# Patient Record
Sex: Male | Born: 1965 | Race: Black or African American | Hispanic: No | Marital: Single | Smoking: Never smoker
Health system: Southern US, Community
[De-identification: ages and names within clinical notes are randomized; demographics above are authoritative.]

## PROBLEM LIST (undated history)

## (undated) DIAGNOSIS — N289 Disorder of kidney and ureter, unspecified: Secondary | ICD-10-CM

## (undated) HISTORY — PX: TUNNELED VENOUS PORT PLACEMENT: SHX819

---

## 2015-12-17 ENCOUNTER — Other Ambulatory Visit: Payer: Self-pay | Admitting: *Deleted

## 2015-12-17 DIAGNOSIS — Z01810 Encounter for preprocedural cardiovascular examination: Secondary | ICD-10-CM

## 2015-12-17 DIAGNOSIS — N186 End stage renal disease: Secondary | ICD-10-CM

## 2015-12-30 ENCOUNTER — Encounter: Payer: Self-pay | Admitting: Vascular Surgery

## 2016-01-05 ENCOUNTER — Ambulatory Visit: Payer: Medicare Other | Admitting: Vascular Surgery

## 2016-01-05 ENCOUNTER — Ambulatory Visit (HOSPITAL_COMMUNITY): Admission: RE | Admit: 2016-01-05 | Payer: Medicare Other | Source: Ambulatory Visit

## 2016-01-05 ENCOUNTER — Ambulatory Visit (HOSPITAL_COMMUNITY): Payer: Medicare Other | Attending: Vascular Surgery

## 2016-01-17 ENCOUNTER — Encounter: Payer: Self-pay | Admitting: Vascular Surgery

## 2016-01-28 ENCOUNTER — Other Ambulatory Visit (HOSPITAL_COMMUNITY): Payer: Medicare Other

## 2016-01-28 ENCOUNTER — Ambulatory Visit: Payer: Medicare Other | Admitting: Vascular Surgery

## 2016-01-28 ENCOUNTER — Encounter (HOSPITAL_COMMUNITY): Payer: Medicare Other

## 2016-05-26 ENCOUNTER — Other Ambulatory Visit (INDEPENDENT_AMBULATORY_CARE_PROVIDER_SITE_OTHER): Payer: Self-pay | Admitting: Vascular Surgery

## 2016-05-26 DIAGNOSIS — R066 Hiccough: Secondary | ICD-10-CM | POA: Diagnosis present

## 2016-05-26 DIAGNOSIS — R509 Fever, unspecified: Secondary | ICD-10-CM | POA: Diagnosis not present

## 2016-05-26 DIAGNOSIS — Z7982 Long term (current) use of aspirin: Secondary | ICD-10-CM

## 2016-05-26 DIAGNOSIS — I959 Hypotension, unspecified: Secondary | ICD-10-CM | POA: Diagnosis not present

## 2016-05-26 DIAGNOSIS — N186 End stage renal disease: Secondary | ICD-10-CM

## 2016-05-26 DIAGNOSIS — Z992 Dependence on renal dialysis: Secondary | ICD-10-CM

## 2016-05-26 DIAGNOSIS — E871 Hypo-osmolality and hyponatremia: Secondary | ICD-10-CM | POA: Diagnosis present

## 2016-05-26 DIAGNOSIS — T80219A Unspecified infection due to central venous catheter, initial encounter: Secondary | ICD-10-CM | POA: Diagnosis not present

## 2016-05-26 DIAGNOSIS — N2581 Secondary hyperparathyroidism of renal origin: Secondary | ICD-10-CM | POA: Diagnosis present

## 2016-05-26 DIAGNOSIS — Y848 Other medical procedures as the cause of abnormal reaction of the patient, or of later complication, without mention of misadventure at the time of the procedure: Secondary | ICD-10-CM | POA: Diagnosis present

## 2016-05-26 DIAGNOSIS — A4101 Sepsis due to Methicillin susceptible Staphylococcus aureus: Secondary | ICD-10-CM | POA: Diagnosis present

## 2016-05-26 DIAGNOSIS — I120 Hypertensive chronic kidney disease with stage 5 chronic kidney disease or end stage renal disease: Secondary | ICD-10-CM | POA: Diagnosis present

## 2016-05-26 DIAGNOSIS — D631 Anemia in chronic kidney disease: Secondary | ICD-10-CM | POA: Diagnosis present

## 2016-05-26 DIAGNOSIS — Z841 Family history of disorders of kidney and ureter: Secondary | ICD-10-CM

## 2016-05-26 DIAGNOSIS — Z79899 Other long term (current) drug therapy: Secondary | ICD-10-CM

## 2016-05-27 ENCOUNTER — Encounter: Payer: Self-pay | Admitting: Emergency Medicine

## 2016-05-27 ENCOUNTER — Inpatient Hospital Stay
Admission: EM | Admit: 2016-05-27 | Discharge: 2016-06-01 | DRG: 314 | Disposition: A | Discharge status: Home / Self Care | Payer: Medicare Other | Attending: Internal Medicine | Admitting: Internal Medicine

## 2016-05-27 ENCOUNTER — Emergency Department: Payer: Medicare Other

## 2016-05-27 DIAGNOSIS — Z841 Family history of disorders of kidney and ureter: Secondary | ICD-10-CM | POA: Diagnosis not present

## 2016-05-27 DIAGNOSIS — Z992 Dependence on renal dialysis: Secondary | ICD-10-CM | POA: Diagnosis not present

## 2016-05-27 DIAGNOSIS — N2581 Secondary hyperparathyroidism of renal origin: Secondary | ICD-10-CM | POA: Diagnosis present

## 2016-05-27 DIAGNOSIS — A4101 Sepsis due to Methicillin susceptible Staphylococcus aureus: Secondary | ICD-10-CM | POA: Diagnosis present

## 2016-05-27 DIAGNOSIS — Z79899 Other long term (current) drug therapy: Secondary | ICD-10-CM | POA: Diagnosis not present

## 2016-05-27 DIAGNOSIS — T80219A Unspecified infection due to central venous catheter, initial encounter: Secondary | ICD-10-CM | POA: Diagnosis present

## 2016-05-27 DIAGNOSIS — R7881 Bacteremia: Secondary | ICD-10-CM | POA: Diagnosis not present

## 2016-05-27 DIAGNOSIS — R509 Fever, unspecified: Secondary | ICD-10-CM | POA: Diagnosis present

## 2016-05-27 DIAGNOSIS — Y848 Other medical procedures as the cause of abnormal reaction of the patient, or of later complication, without mention of misadventure at the time of the procedure: Secondary | ICD-10-CM | POA: Diagnosis present

## 2016-05-27 DIAGNOSIS — T82838A Hemorrhage of vascular prosthetic devices, implants and grafts, initial encounter: Secondary | ICD-10-CM | POA: Diagnosis not present

## 2016-05-27 DIAGNOSIS — I120 Hypertensive chronic kidney disease with stage 5 chronic kidney disease or end stage renal disease: Secondary | ICD-10-CM | POA: Diagnosis present

## 2016-05-27 DIAGNOSIS — D631 Anemia in chronic kidney disease: Secondary | ICD-10-CM | POA: Diagnosis present

## 2016-05-27 DIAGNOSIS — Z7982 Long term (current) use of aspirin: Secondary | ICD-10-CM | POA: Diagnosis not present

## 2016-05-27 DIAGNOSIS — I959 Hypotension, unspecified: Secondary | ICD-10-CM | POA: Diagnosis not present

## 2016-05-27 DIAGNOSIS — E871 Hypo-osmolality and hyponatremia: Secondary | ICD-10-CM | POA: Diagnosis present

## 2016-05-27 DIAGNOSIS — N186 End stage renal disease: Secondary | ICD-10-CM | POA: Diagnosis present

## 2016-05-27 DIAGNOSIS — R066 Hiccough: Secondary | ICD-10-CM | POA: Diagnosis present

## 2016-05-27 DIAGNOSIS — A419 Sepsis, unspecified organism: Secondary | ICD-10-CM | POA: Diagnosis present

## 2016-05-27 HISTORY — DX: Disorder of kidney and ureter, unspecified: N28.9

## 2016-05-27 LAB — COMPREHENSIVE METABOLIC PANEL
ALBUMIN: 3.8 g/dL (ref 3.5–5.0)
ALK PHOS: 45 U/L (ref 38–126)
ALT: 44 U/L (ref 17–63)
ANION GAP: 19 — AB (ref 5–15)
AST: 38 U/L (ref 15–41)
BILIRUBIN TOTAL: 0.7 mg/dL (ref 0.3–1.2)
BUN: 66 mg/dL — ABNORMAL HIGH (ref 6–20)
CALCIUM: 9 mg/dL (ref 8.9–10.3)
CO2: 26 mmol/L (ref 22–32)
Chloride: 85 mmol/L — ABNORMAL LOW (ref 101–111)
Creatinine, Ser: 17.76 mg/dL — ABNORMAL HIGH (ref 0.61–1.24)
GFR, EST AFRICAN AMERICAN: 3 mL/min — AB (ref >60)
GFR, EST NON AFRICAN AMERICAN: 3 mL/min — AB (ref >60)
GLUCOSE: 121 mg/dL — AB (ref 65–99)
Potassium: 4.8 mmol/L (ref 3.5–5.1)
Sodium: 130 mmol/L — ABNORMAL LOW (ref 135–145)
TOTAL PROTEIN: 7.7 g/dL (ref 6.5–8.1)

## 2016-05-27 LAB — CBC WITH DIFFERENTIAL/PLATELET
BASOS PCT: 0 %
Basophils Absolute: 0 10*3/uL (ref 0–0.1)
EOS ABS: 0.1 10*3/uL (ref 0–0.7)
Eosinophils Relative: 1 %
HCT: 42.9 % (ref 40.0–52.0)
HEMOGLOBIN: 14.4 g/dL (ref 13.0–18.0)
Lymphocytes Relative: 4 %
Lymphs Abs: 0.5 10*3/uL — ABNORMAL LOW (ref 1.0–3.6)
MCH: 30.6 pg (ref 26.0–34.0)
MCHC: 33.6 g/dL (ref 32.0–36.0)
MCV: 91 fL (ref 80.0–100.0)
MONOS PCT: 9 %
Monocytes Absolute: 1.1 10*3/uL — ABNORMAL HIGH (ref 0.2–1.0)
NEUTROS PCT: 86 %
Neutro Abs: 9.9 10*3/uL — ABNORMAL HIGH (ref 1.4–6.5)
Platelets: 131 10*3/uL — ABNORMAL LOW (ref 150–440)
RBC: 4.72 MIL/uL (ref 4.40–5.90)
RDW: 15.2 % — AB (ref 11.5–14.5)
WBC: 11.6 10*3/uL — ABNORMAL HIGH (ref 3.8–10.6)

## 2016-05-27 LAB — MRSA PCR SCREENING

## 2016-05-27 LAB — LACTIC ACID, PLASMA: LACTIC ACID, VENOUS: 1.3 mmol/L (ref 0.5–1.9)

## 2016-05-27 LAB — TSH: TSH: 3.836 u[IU]/mL (ref 0.350–4.500)

## 2016-05-27 MED ORDER — BACLOFEN 10 MG PO TABS
10.0000 mg | ORAL_TABLET | Freq: Every day | ORAL | Status: DC | PRN
  Administered 2016-05-28: 10 mg via ORAL
  Filled 2016-05-27: qty 1

## 2016-05-27 MED ORDER — VANCOMYCIN HCL IN DEXTROSE 1-5 GM/200ML-% IV SOLN
1000.0000 mg | INTRAVENOUS | Status: DC
  Administered 2016-05-27: 1000 mg via INTRAVENOUS

## 2016-05-27 MED ORDER — DOCUSATE SODIUM 100 MG PO CAPS
100.0000 mg | ORAL_CAPSULE | Freq: Two times a day (BID) | ORAL | Status: DC
  Administered 2016-05-27 – 2016-05-31 (×7): 100 mg via ORAL
  Filled 2016-05-27 (×8): qty 1

## 2016-05-27 MED ORDER — SODIUM CHLORIDE 0.9 % IV BOLUS (SEPSIS): 500.0000 mL | INTRAVENOUS | Status: DC | PRN

## 2016-05-27 MED ORDER — SODIUM CHLORIDE 0.9 % IV BOLUS (SEPSIS)
500.0000 mL | Freq: Once | INTRAVENOUS | Status: AC
  Administered 2016-05-27: 500 mL via INTRAVENOUS

## 2016-05-27 MED ORDER — VANCOMYCIN HCL IN DEXTROSE 1-5 GM/200ML-% IV SOLN
1000.0000 mg | Freq: Once | INTRAVENOUS | Status: AC
  Administered 2016-05-27: 1000 mg via INTRAVENOUS
  Filled 2016-05-27: qty 200

## 2016-05-27 MED ORDER — SODIUM CHLORIDE 0.9% FLUSH
3.0000 mL | Freq: Two times a day (BID) | INTRAVENOUS | Status: DC
  Administered 2016-05-27 – 2016-05-31 (×10): 3 mL via INTRAVENOUS

## 2016-05-27 MED ORDER — PIPERACILLIN-TAZOBACTAM 3.375 G IVPB
3.3750 g | Freq: Two times a day (BID) | INTRAVENOUS | Status: DC
  Administered 2016-05-28: 3.375 g via INTRAVENOUS
  Filled 2016-05-27: qty 50

## 2016-05-27 MED ORDER — VANCOMYCIN HCL IN DEXTROSE 1-5 GM/200ML-% IV SOLN
1000.0000 mg | INTRAVENOUS | Status: DC | PRN
  Filled 2016-05-27 (×2): qty 200

## 2016-05-27 MED ORDER — SODIUM CHLORIDE 0.9 % IV SOLN
12.5000 mg | Freq: Three times a day (TID) | INTRAVENOUS | Status: DC | PRN
  Administered 2016-05-27 – 2016-05-29 (×4): 12.5 mg via INTRAVENOUS
  Filled 2016-05-27 (×9): qty 0.5

## 2016-05-27 MED ORDER — BACLOFEN 10 MG PO TABS: 10.0000 mg | ORAL_TABLET | Freq: Three times a day (TID) | ORAL | Status: DC

## 2016-05-27 MED ORDER — ONDANSETRON HCL 4 MG PO TABS: 4.0000 mg | ORAL_TABLET | Freq: Four times a day (QID) | ORAL | Status: DC | PRN

## 2016-05-27 MED ORDER — ACETAMINOPHEN 325 MG PO TABS
ORAL_TABLET | ORAL | Status: AC
  Administered 2016-05-27: 650 mg via ORAL
  Filled 2016-05-27: qty 2

## 2016-05-27 MED ORDER — CALCIUM ACETATE (PHOS BINDER) 667 MG/5ML PO SOLN
667.0000 mg | Freq: Three times a day (TID) | ORAL | Status: DC
  Administered 2016-05-27 – 2016-05-31 (×7): 667 mg via ORAL
  Filled 2016-05-27 (×17): qty 5

## 2016-05-27 MED ORDER — ASPIRIN EC 81 MG PO TBEC
81.0000 mg | DELAYED_RELEASE_TABLET | Freq: Every day | ORAL | Status: DC
  Administered 2016-05-27 – 2016-05-31 (×5): 81 mg via ORAL
  Filled 2016-05-27 (×5): qty 1

## 2016-05-27 MED ORDER — ACETAMINOPHEN 325 MG PO TABS
650.0000 mg | ORAL_TABLET | Freq: Four times a day (QID) | ORAL | Status: DC | PRN
  Administered 2016-05-27 – 2016-06-01 (×6): 650 mg via ORAL
  Filled 2016-05-27 (×5): qty 2

## 2016-05-27 MED ORDER — RENA-VITE PO TABS
1.0000 | ORAL_TABLET | Freq: Every day | ORAL | Status: DC
  Administered 2016-05-27 – 2016-05-31 (×5): 1 via ORAL
  Filled 2016-05-27 (×5): qty 1

## 2016-05-27 MED ORDER — ACETAMINOPHEN 650 MG RE SUPP: 650.0000 mg | Freq: Four times a day (QID) | RECTAL | Status: DC | PRN

## 2016-05-27 MED ORDER — ACETAMINOPHEN 325 MG PO TABS
650.0000 mg | ORAL_TABLET | Freq: Once | ORAL | Status: AC
  Administered 2016-05-27: 650 mg via ORAL

## 2016-05-27 MED ORDER — VANCOMYCIN HCL IN DEXTROSE 750-5 MG/150ML-% IV SOLN
750.0000 mg | Freq: Once | INTRAVENOUS | Status: AC
Start: 2016-05-27 — End: 2016-05-27
  Administered 2016-05-27: 750 mg via INTRAVENOUS
  Filled 2016-05-27: qty 150

## 2016-05-27 MED ORDER — PIPERACILLIN-TAZOBACTAM 3.375 G IVPB 30 MIN
3.3750 g | Freq: Once | INTRAVENOUS | Status: AC
  Administered 2016-05-27: 3.375 g via INTRAVENOUS
  Filled 2016-05-27: qty 50

## 2016-05-27 MED ORDER — ONDANSETRON HCL 4 MG/2ML IJ SOLN: 4.0000 mg | Freq: Four times a day (QID) | INTRAMUSCULAR | Status: DC | PRN

## 2016-05-27 MED ORDER — BACLOFEN 10 MG PO TABS
ORAL_TABLET | ORAL | Status: AC
  Filled 2016-05-27: qty 1

## 2016-05-27 MED ORDER — DILTIAZEM HCL ER COATED BEADS 120 MG PO CP24
120.0000 mg | ORAL_CAPSULE | Freq: Every day | ORAL | Status: DC
  Administered 2016-05-27 – 2016-05-31 (×5): 120 mg via ORAL
  Filled 2016-05-27 (×5): qty 1

## 2016-05-27 MED ORDER — HEPARIN SODIUM (PORCINE) 5000 UNIT/ML IJ SOLN
5000.0000 [IU] | Freq: Three times a day (TID) | INTRAMUSCULAR | Status: DC
  Administered 2016-05-27 – 2016-05-31 (×12): 5000 [IU] via SUBCUTANEOUS
  Filled 2016-05-27 (×13): qty 1

## 2016-05-27 MED ORDER — VANCOMYCIN HCL 500 MG IV SOLR
250.0000 mg | Freq: Once | INTRAVENOUS | Status: AC
  Administered 2016-05-27: 250 mg via INTRAVENOUS
  Filled 2016-05-27: qty 250

## 2016-05-27 MED ORDER — BACLOFEN 10 MG PO TABS
10.0000 mg | ORAL_TABLET | Freq: Once | ORAL | Status: AC
  Administered 2016-05-27: 10 mg via ORAL

## 2016-05-27 NOTE — Progress Notes (Signed)
Hd start 

## 2016-05-27 NOTE — Progress Notes (Signed)
Post hd vitals 

## 2016-05-27 NOTE — Progress Notes (Signed)
Pre hd info 

## 2016-05-27 NOTE — Progress Notes (Signed)
Post hd assessment 

## 2016-05-27 NOTE — ED Notes (Signed)
Pt transported to room 225. 

## 2016-05-27 NOTE — Progress Notes (Signed)
[facility]  ROUNDING NOTE   Subjective:   Admitted for sepsis.  Blood cultures from 11/2 showing staph species.   Seen and examined on hemodialysis. Tolerating treatment well.   Objective:  Vital signs in last 24 hours:  Temp:  [98.5 F (36.9 C)-100.9 F (38.3 C)] (P) 98.5 F (36.9 C) (11/04 1145) Pulse Rate:  [95-140] (P) 95 (11/04 1145) Resp:  [17-23] (P) 18 (11/04 1145) BP: (80-110)/(53-65) (P) 117/72 (11/04 1145) SpO2:  [92 %-100 %] (P) 98 % (11/04 1145) Weight:  [86.2 kg (190 lb)-87 kg (191 lb 12.8 oz)] 87 kg (191 lb 12.8 oz) (11/04 0341)  Weight change:  Filed Weights   05/27/16 0006 05/27/16 0341  Weight: 86.2 kg (190 lb) 87 kg (191 lb 12.8 oz)    Intake/Output: I/O last 3 completed shifts: In: 120 [P.O.:120] Out: 0    Intake/Output this shift:  No intake/output data recorded.  Physical Exam: General: NAD  Head: Normocephalic, atraumatic. Moist oral mucosal membranes  Eyes: Anicteric, PERRL  Neck: Supple, trachea midline  Lungs:  Clear to auscultation  Heart: Regular rate and rhythm  Abdomen:  Soft, nontender,   Extremities: no peripheral edema.  Neurologic: Nonfocal, moving all four extremities  Skin: No lesions  Access: Maturing left AVF, RIJ permcath    Basic Metabolic Panel:  Recent Labs Lab 05/27/16 0018  NA 130*  K 4.8  CL 85*  CO2 26  GLUCOSE 121*  BUN 66*  CREATININE 17.76*  CALCIUM 9.0    Liver Function Tests:  Recent Labs Lab 05/27/16 0018  AST 38  ALT 44  ALKPHOS 45  BILITOT 0.7  PROT 7.7  ALBUMIN 3.8   No results for input(s): LIPASE, AMYLASE in the last 168 hours. No results for input(s): AMMONIA in the last 168 hours.  CBC:  Recent Labs Lab 05/27/16 0018  WBC 11.6*  NEUTROABS 9.9*  HGB 14.4  HCT 42.9  MCV 91.0  PLT 131*    Cardiac Enzymes: No results for input(s): CKTOTAL, CKMB, CKMBINDEX, TROPONINI in the last 168 hours.  BNP: Invalid input(s): POCBNP  CBG: No results for input(s):  GLUCAP in the last 168 hours.  Microbiology: Results for orders placed or performed during the hospital encounter of 05/27/16  Culture, blood (Routine x 2)     Status: None (Preliminary result)   Collection Time: 05/27/16 12:18 AM  Result Value Ref Range Status   Specimen Description BLOOD BLOOD RIGHT FOREARM  Final   Special Requests BOTTLES DRAWN AEROBIC AND ANAEROBIC 7CCAERO,7CCANA  Final   Culture NO GROWTH < 12 HOURS  Final   Report Status PENDING  Incomplete  Culture, blood (Routine x 2)     Status: None (Preliminary result)   Collection Time: 05/27/16 12:18 AM  Result Value Ref Range Status   Specimen Description BLOOD RIGHT FOREARM LATERAL  Final   Special Requests   Final    BOTTLES DRAWN AEROBIC AND ANAEROBIC 12CCAERO,9CCANA   Culture NO GROWTH < 12 HOURS  Final   Report Status PENDING  Incomplete  MRSA PCR Screening     Status: Abnormal   Collection Time: 05/27/16  4:02 AM  Result Value Ref Range Status   MRSA by PCR (A) NEGATIVE Final    INVALID, UNABLE TO DETERMINE THE PRESENCE OF TARGET DNA DUE TO SPECIMEN INTEGRITY. RECOLLECTION REQUESTED.    Comment: CALLED DAWN SONGSCER ON 05/27/16 AT 0715 BY [facility]    Coagulation Studies: No results for input(s): LABPROT, INR in the last 72  hours.  Urinalysis: No results for input(s): COLORURINE, LABSPEC, PHURINE, GLUCOSEU, HGBUR, BILIRUBINUR, KETONESUR, PROTEINUR, UROBILINOGEN, NITRITE, LEUKOCYTESUR in the last 72 hours.  Invalid input(s): APPERANCEUR    Imaging: Dg Chest 2 View  Result Date: 05/27/2016 CLINICAL DATA:  Acute onset of nausea and vomiting. Fever. Burning sensation at the patient's tunneled catheter. Initial encounter. EXAM: CHEST  2 VIEW COMPARISON:  None. FINDINGS: The lungs are well-aerated. Vascular congestion is noted. A right-sided dual-lumen catheter is noted ending about the distal SVC. There is no evidence of focal opacification, pleural effusion or pneumothorax. The heart is normal in size; the mediastinal  contour is within normal limits. No acute osseous abnormalities are seen. IMPRESSION: Vascular congestion noted. Lungs remain grossly clear. Right-sided dual-lumen catheter noted ending about the distal SVC. Electronically Signed   By: Roanna RaiderJeffery  Chang M.D.   On: 05/27/2016 00:53     Medications:     . aspirin EC  81 mg Oral Daily  . calcium acetate (Phos Binder)  667 mg Oral TID WC  . diltiazem  120 mg Oral Daily  . docusate sodium  100 mg Oral BID  . heparin  5,000 Units Subcutaneous Q8H  . multivitamin  1 tablet Oral Daily  . piperacillin-tazobactam (ZOSYN)  IV  3.375 g Intravenous Q12H  . sodium chloride flush  3 mL Intravenous Q12H   acetaminophen **OR** acetaminophen, baclofen, chlorproMAZINE (THORAZINE) IV, ondansetron **OR** ondansetron (ZOFRAN) IV, sodium chloride, vancomycin  Assessment/ Plan:  Ronnie Cox is a 50 y.o. black male with hypertension, end stage renal disease on hemodialysis.   TTS [facility] [address]   1. End Stage Renal Disease: seen and examined on hemodialysis.  Positive blood cultures as outpatient. 11/2 growing staph species.  Vascular consulted.   2. Hypertension: hypotension due to fever/sepsis - hold home medications.   3. Secondary Hyperparathyroidism with hyperphosphatemia: outpatient PTH 560, phosphorus 6.4 - restart calcium acetate.   4. Anemia of chronic kidney disease: hemoglobin at goal. No indication for epo.    LOS: 0 Ronnie Cox 11/4/201712:03 PM

## 2016-05-27 NOTE — Progress Notes (Addendum)
[facility] [place] [facility]   PATIENT NAME: Ronnie HatchShawn Cox    MR#:  [id number]  DATE OF BIRTH:  1966-01-03  SUBJECTIVE:  CHIEF COMPLAINT:   Chief Complaint  Patient presents with  . Fever   No complaint except fever. On HD. REVIEW OF SYSTEMS:  Review of Systems  Constitutional: Positive for fever. Negative for chills and malaise/fatigue.  Eyes: Negative for blurred vision and double vision.  Respiratory: Negative for cough, shortness of breath, wheezing and stridor.   Cardiovascular: Negative for chest pain and leg swelling.  Gastrointestinal: Negative for abdominal pain, diarrhea, nausea and vomiting.  Genitourinary: Negative for dysuria.  Musculoskeletal: Negative for joint pain.  Skin: Negative for rash.  Neurological: Negative for dizziness, focal weakness, loss of consciousness, weakness and headaches.  Psychiatric/Behavioral: Negative for depression. The patient is not nervous/anxious.     DRUG ALLERGIES:   Allergies  Allergen Reactions  . Iodine Anaphylaxis   VITALS:  Blood pressure (!) 107/53, pulse (!) 113, temperature 98.5 F (36.9 C), temperature source Oral, resp. rate 18, height 6\' 3"  (1.905 m), weight 191 lb 2.2 oz (86.7 kg), SpO2 93 %. PHYSICAL EXAMINATION:  Physical Exam  Constitutional: He is oriented to person, place, and time and well-developed, well-nourished, and in no distress.  HENT:  Mouth/Throat: Oropharynx is clear and moist.  Eyes: Conjunctivae and EOM are normal.  Neck: Normal range of motion. Neck supple. No JVD present. No tracheal deviation present.  Cardiovascular: Normal rate, regular rhythm and normal heart sounds.  Exam reveals no gallop.   No murmur heard. Pulmonary/Chest: Effort normal and breath sounds normal. No respiratory distress. He has no wheezes. He has no rales.  Abdominal: Soft. Bowel sounds are normal. He exhibits no distension. There is no tenderness.  Musculoskeletal: Normal range of motion.  He exhibits no edema.  Neurological: He is alert and oriented to person, place, and time. No cranial nerve deficit.  Skin: No rash noted. No erythema.  Psychiatric: Affect and judgment normal.   LABORATORY PANEL:   CBC  Recent Labs Lab 05/27/16 0018  WBC 11.6*  HGB 14.4  HCT 42.9  PLT 131*   ------------------------------------------------------------------------------------------------------------------ Chemistries   Recent Labs Lab 05/27/16 0018  NA 130*  K 4.8  CL 85*  CO2 26  GLUCOSE 121*  BUN 66*  CREATININE 17.76*  CALCIUM 9.0  AST 38  ALT 44  ALKPHOS 45  BILITOT 0.7   RADIOLOGY:  Dg Chest 2 View  Result Date: 05/27/2016 CLINICAL DATA:  Acute onset of nausea and vomiting. Fever. Burning sensation at the patient's tunneled catheter. Initial encounter. EXAM: CHEST  2 VIEW COMPARISON:  None. FINDINGS: The lungs are well-aerated. Vascular congestion is noted. A right-sided dual-lumen catheter is noted ending about the distal SVC. There is no evidence of focal opacification, pleural effusion or pneumothorax. The heart is normal in size; the mediastinal contour is within normal limits. No acute osseous abnormalities are seen. IMPRESSION: Vascular congestion noted. Lungs remain grossly clear. Right-sided dual-lumen catheter noted ending about the distal SVC. Electronically Signed   By: Roanna RaiderJeffery  Chang M.D.   On: 05/27/2016 00:53   ASSESSMENT AND PLAN:   This is a 50 year old male admitted for sepsis. 1. Sepsis with staph aureus bacteremia. Likely source of infection is tunneled dialysis catheter. His outpatient blood cultures show staph aureus per Dr. Wynelle LinkKolluru.  Continue vancomycin and Zosyn for now. Follow up blood culture. Remove dialysis catheter after hemodialysis today per Dr. Wynelle LinkKolluru.  ID consult.  2. End-stage renal disease: Remove dialysis catheter after hemodialysis today per Dr. Wynelle LinkKolluru.  3. Hiccups: Started following vancomycin which the patient states is a  common side effect for him. He received baclofen in the emergency department without relief. On a small dose of Thorazine.  Hyponatremia. Adjust during hemodialysis.  per Dr. Wynelle LinkKolluru  All the records are reviewed and case discussed with Care Management/Social Worker. Management plans discussed with the patient, family and they are in agreement.  CODE STATUS: Full code  TOTAL TIME TAKING CARE OF THIS PATIENT: 36 minutes.   More than 50% of the time was spent in counseling/coordination of care: YES  POSSIBLE D/C IN 3 DAYS, DEPENDING ON CLINICAL CONDITION.   Shaune Pollackhen, Farhana Fellows M.D on 05/27/2016 at 4:02 PM  Between 7am to 6pm - Pager - [phone]  After 6pm go to [email or web address] - Social research officer, governmentpassword [email or web address]  [facility]  Office  [phone]  CC: Primary care physician; Pcp Not In System  Note: This dictation was prepared with Dragon dictation along with smaller phrase technology. Any transcriptional errors that result from this process are unintentional.

## 2016-05-27 NOTE — Progress Notes (Signed)
  End of hd 

## 2016-05-27 NOTE — Progress Notes (Signed)
Pharmacy Antibiotic Note  Ronnie Cox is a 50 y.o. male admitted on 05/27/2016 with sepsis.  Pharmacy has been consulted for vancomycin and Zosyn dosing.  Plan: ESRD on HD DW 86kg Vancomycin 2 grams total loading dose ordered. 1 gram with each HD Level will need to be ordered when HD schedule determined.  Zosyn 3.375 grams q 12 hours ordered.  Height: 6\' 3"  (190.5 cm) Weight: 190 lb (86.2 kg) IBW/kg (Calculated) : 84.5  Temp (24hrs), Avg:100.3 F (37.9 C), Min:99.4 F (37.4 C), Max:100.9 F (38.3 C)   Recent Labs Lab 05/27/16 0018  WBC 11.6*  CREATININE 17.76*  LATICACIDVEN 1.3    Estimated Creatinine Clearance: 6.1 mL/min (by C-G formula based on SCr of 17.76 mg/dL (H)).    Allergies  Allergen Reactions  . Iodine Anaphylaxis    Antimicrobials this admission: vancomycn 11/4 >>  Zosyn  11/4 >>   Dose adjustments this admission:   Microbiology results: 11/4  BCx: pending     Thank you for allowing pharmacy to be a part of this patient's care.  Lanijah Warzecha S 05/27/2016 2:38 AM

## 2016-05-27 NOTE — H&P (Signed)
Ronnie Cox is an 50 y.o. male.   Chief Complaint: Fever HPI: The patient with past medical history of end-stage renal disease on dialysis Monday Wednesday Friday presents to the emergency department after 2 days of fever. Yesterday MAXIMUM TEMPERATURE was 103; today temperature is 100.81F. The patient admits to nausea and some vomiting. He has had infections of his tunneled perm catheter on 2 previous occasions. In the emergency department vital signs indicated sepsis and he was started on broad-spectrum antibiotics as well as gentle fluid hydration. After initiation of septic protocol the emergency department staff called the hospitalist service for admission.  Past Medical History:  Diagnosis Date  . Renal disorder     Past Surgical History:  Procedure Laterality Date  . TUNNELED VENOUS PORT PLACEMENT      Family History  Problem Relation Age of Onset  . Renal Disease Mother    Social History:  reports that he has never smoked. He has never used smokeless tobacco. He reports that he does not drink alcohol or use drugs.  Allergies:  Allergies  Allergen Reactions  . Iodine Anaphylaxis    Medications Prior to Admission  Medication Sig Dispense Refill  . aspirin EC 81 MG tablet Take 81 mg by mouth daily.    . baclofen (LIORESAL) 10 MG tablet Take 10 mg by mouth daily as needed for muscle spasms.    . calcium acetate, Phos Binder, (PHOSLYRA) 667 MG/5ML SOLN Take 667 mg by mouth 3 (three) times daily with meals.    . DilTIAZem HCl ER Beads (DILTZAC PO) Take 1 tablet by mouth daily.    . multivitamin (RENA-VIT) TABS tablet Take 1 tablet by mouth daily.      Results for orders placed or performed during the hospital encounter of 05/27/16 (from the past 48 hour(s))  Comprehensive metabolic panel     Status: Abnormal   Collection Time: 05/27/16 12:18 AM  Result Value Ref Range   Sodium 130 (L) 135 - 145 mmol/L   Potassium 4.8 3.5 - 5.1 mmol/L   Chloride 85 (L) 101 - 111 mmol/L    CO2 26 22 - 32 mmol/L   Glucose, Bld 121 (H) 65 - 99 mg/dL   BUN 66 (H) 6 - 20 mg/dL   Creatinine, Ser 17.76 (H) 0.61 - 1.24 mg/dL   Calcium 9.0 8.9 - 10.3 mg/dL   Total Protein 7.7 6.5 - 8.1 g/dL   Albumin 3.8 3.5 - 5.0 g/dL   AST 38 15 - 41 U/L   ALT 44 17 - 63 U/L   Alkaline Phosphatase 45 38 - 126 U/L   Total Bilirubin 0.7 0.3 - 1.2 mg/dL   GFR calc non Af Amer 3 (L) >60 mL/min   GFR calc Af Amer 3 (L) >60 mL/min    Comment: (NOTE) The eGFR has been calculated using the CKD EPI equation. This calculation has not been validated in all clinical situations. eGFR's persistently <60 mL/min signify possible Chronic Kidney Disease.    Anion gap 19 (H) 5 - 15  Lactic acid, plasma     Status: None   Collection Time: 05/27/16 12:18 AM  Result Value Ref Range   Lactic Acid, Venous 1.3 0.5 - 1.9 mmol/L  CBC with Differential     Status: Abnormal   Collection Time: 05/27/16 12:18 AM  Result Value Ref Range   WBC 11.6 (H) 3.8 - 10.6 K/uL   RBC 4.72 4.40 - 5.90 MIL/uL   Hemoglobin 14.4 13.0 - 18.0 g/dL  HCT 42.9 40.0 - 52.0 %   MCV 91.0 80.0 - 100.0 fL   MCH 30.6 26.0 - 34.0 pg   MCHC 33.6 32.0 - 36.0 g/dL   RDW 15.2 (H) 11.5 - 14.5 %   Platelets 131 (L) 150 - 440 K/uL   Neutrophils Relative % 86 %   Neutro Abs 9.9 (H) 1.4 - 6.5 K/uL   Lymphocytes Relative 4 %   Lymphs Abs 0.5 (L) 1.0 - 3.6 K/uL   Monocytes Relative 9 %   Monocytes Absolute 1.1 (H) 0.2 - 1.0 K/uL   Eosinophils Relative 1 %   Eosinophils Absolute 0.1 0 - 0.7 K/uL   Basophils Relative 0 %   Basophils Absolute 0.0 0 - 0.1 K/uL  TSH     Status: None   Collection Time: 05/27/16 12:18 AM  Result Value Ref Range   TSH 3.836 0.350 - 4.500 uIU/mL    Comment: Performed by a 3rd Generation assay with a functional sensitivity of <=0.01 uIU/mL.   Dg Chest 2 View  Result Date: 05/27/2016 CLINICAL DATA:  Acute onset of nausea and vomiting. Fever. Burning sensation at the patient's tunneled catheter. Initial encounter.  EXAM: CHEST  2 VIEW COMPARISON:  None. FINDINGS: The lungs are well-aerated. Vascular congestion is noted. A right-sided dual-lumen catheter is noted ending about the distal SVC. There is no evidence of focal opacification, pleural effusion or pneumothorax. The heart is normal in size; the mediastinal contour is within normal limits. No acute osseous abnormalities are seen. IMPRESSION: Vascular congestion noted. Lungs remain grossly clear. Right-sided dual-lumen catheter noted ending about the distal SVC. Electronically Signed   By: Garald Balding M.D.   On: 05/27/2016 00:53    Review of Systems  Constitutional: Positive for fever. Negative for chills.  HENT: Negative for sore throat and tinnitus.   Eyes: Negative for blurred vision and redness.  Respiratory: Negative for cough and shortness of breath.   Cardiovascular: Negative for chest pain, palpitations, orthopnea and PND.  Gastrointestinal: Positive for nausea and vomiting. Negative for abdominal pain and diarrhea.  Genitourinary: Negative for dysuria, frequency and urgency.  Musculoskeletal: Negative for joint pain and myalgias.  Skin: Negative for rash.       No lesions  Neurological: Negative for speech change, focal weakness and weakness.  Endo/Heme/Allergies: Does not bruise/bleed easily.       No temperature intolerance  Psychiatric/Behavioral: Negative for depression and suicidal ideas.    Blood pressure (!) 92/53, pulse (!) 108, temperature 99.3 F (37.4 C), temperature source Oral, resp. rate 18, height 6' 3"  (1.905 m), weight 87 kg (191 lb 12.8 oz), SpO2 96 %. Physical Exam  Nursing note and vitals reviewed. Constitutional: He is oriented to person, place, and time. He appears well-developed and well-nourished. No distress.  HENT:  Head: Normocephalic and atraumatic.  Mouth/Throat: Oropharynx is clear and moist.  Eyes: Conjunctivae and EOM are normal. Pupils are equal, round, and reactive to light. No scleral icterus.   Neck: Normal range of motion. Neck supple. No JVD present. No tracheal deviation present. No thyromegaly present.  Cardiovascular: Normal rate, regular rhythm and normal heart sounds.  Exam reveals no gallop and no friction rub.   No murmur heard. Respiratory: Effort normal and breath sounds normal. No respiratory distress.  GI: Soft. Bowel sounds are normal. He exhibits no distension. There is no tenderness.  Genitourinary:  Genitourinary Comments: Deferred  Musculoskeletal: Normal range of motion. He exhibits no edema.  Lymphadenopathy:    He has  no cervical adenopathy.  Neurological: He is alert and oriented to person, place, and time. No cranial nerve deficit.  Skin: Skin is warm and dry. No rash noted. No erythema.  Psychiatric: He has a normal mood and affect. His behavior is normal. Judgment and thought content normal.     Assessment/Plan This is a 50 year old male admitted for sepsis. 1. Sepsis: The patient meets criteria via fever, leukocytosis and tachycardia. He is hemodynamically stable although he admits that his blood pressure is usually in the low 100s. He is fluid responsive. We will continue gentle hydration so as not to fluid overload the patient. Likely source of infection is tunneled dialysis catheter. 2. End-stage renal disease: On dialysis. Nephrology has been consulted. 3. Hiccups: Started following vancomycin which the patient states is a common side effect for him. He received baclofen in the emergency department without relief. We will get a small dose of Thorazine. 4. DVT prophylaxis: Heparin 5. GI prophylaxis: None The patient is a full code. Time spent on admission was inpatient care approximately 45 minutes  Harrie Foreman, MD 05/27/2016, 6:11 AM

## 2016-05-27 NOTE — Progress Notes (Signed)
PHARMACY - PHYSICIAN COMMUNICATION CRITICAL VALUE ALERT - BLOOD CULTURE IDENTIFICATION (BCID)  Results for orders placed or performed during the hospital encounter of 05/27/16  Blood Culture ID Panel (Reflexed) (Collected: 05/27/2016 12:18 AM)  Result Value Ref Range   Enterococcus species NOT DETECTED NOT DETECTED   Listeria monocytogenes NOT DETECTED NOT DETECTED   Staphylococcus species DETECTED (A) NOT DETECTED   Staphylococcus aureus DETECTED (A) NOT DETECTED   Methicillin resistance NOT DETECTED NOT DETECTED   Streptococcus species NOT DETECTED NOT DETECTED   Streptococcus agalactiae NOT DETECTED NOT DETECTED   Streptococcus pneumoniae NOT DETECTED NOT DETECTED   Streptococcus pyogenes NOT DETECTED NOT DETECTED   Acinetobacter baumannii NOT DETECTED NOT DETECTED   Enterobacteriaceae species NOT DETECTED NOT DETECTED   Enterobacter cloacae complex NOT DETECTED NOT DETECTED   Escherichia coli NOT DETECTED NOT DETECTED   Klebsiella oxytoca NOT DETECTED NOT DETECTED   Klebsiella pneumoniae NOT DETECTED NOT DETECTED   Proteus species NOT DETECTED NOT DETECTED   Serratia marcescens NOT DETECTED NOT DETECTED   Haemophilus influenzae NOT DETECTED NOT DETECTED   Neisseria meningitidis NOT DETECTED NOT DETECTED   Pseudomonas aeruginosa NOT DETECTED NOT DETECTED   Candida albicans NOT DETECTED NOT DETECTED   Candida glabrata NOT DETECTED NOT DETECTED   Candida krusei NOT DETECTED NOT DETECTED   Candida parapsilosis NOT DETECTED NOT DETECTED   Candida tropicalis NOT DETECTED NOT DETECTED    Name of physician (or Provider) Contacted: Dr. Auburn BilberryShreyang Patel  Recommendation: To narrow therapy based on BCID results showing staph aureus (MecA negative). Recommendation to switch to cefazolin or D/C of vancomycin    Changes to prescribed antibiotics required: MD does not want to make Abx changes at this time. Will f/U with rounding MD in AM.   Lucius ConnSheema M Melba Araki 05/27/2016  7:45 PM

## 2016-05-27 NOTE — ED Triage Notes (Signed)
Pt states this past Thursday night that he experienced nausea and vomiting after eating. Pt states that he has been febrile x2 days and that his tunnel catheter site is burning and itching. Pt is a dialysis pt and receives dialysis Tuesday, Thursday, and Saturday. Pt is experiencing nausea at this time.

## 2016-05-27 NOTE — ED Notes (Signed)
Pt states he rarely makes urine

## 2016-05-27 NOTE — ED Provider Notes (Signed)
[facility] Emergency Department Provider Note    First MD Initiated Contact with Patient 05/27/16 [phone]     (approximate)  I have reviewed the triage vital signs and the nursing notes.   HISTORY  Chief Complaint Fever    HPI Ronnie Cox is a 50 y.o. male history of hypertension and end-stage renal disease receives dialysis Tuesday Thursday Saturday. Patient presents to the emergency department with fever 2 days. Patient denies any other symptoms no nausea vomiting or diarrhea. Patient denies any coughing. Of note patient has an indwelling catheter in the right subclavian area of his chest. Patient states that he has been "septic 3 times from indwelling catheter for dialysis". Patient presents emergency Department febrile with a temperature 100.9 tachycardic heart rate 140 hypotensive blood pressure 80/56   Past Medical History:  Diagnosis Date  . Renal disorder     There are no active problems to display for this patient.   Past Surgical History:  Procedure Laterality Date  . TUNNELED VENOUS PORT PLACEMENT      Prior to Admission medications   Medication Sig Start Date End Date Taking? Authorizing Provider  aspirin EC 81 MG tablet Take 81 mg by mouth daily.   Yes Historical Provider, MD  baclofen (LIORESAL) 10 MG tablet Take 10 mg by mouth daily as needed for muscle spasms.   Yes Historical Provider, MD  calcium acetate, Phos Binder, (PHOSLYRA) 667 MG/5ML SOLN Take 667 mg by mouth 3 (three) times daily with meals.   Yes Historical Provider, MD  DilTIAZem HCl ER Beads (DILTZAC PO) Take 1 tablet by mouth daily.   Yes Historical Provider, MD  multivitamin (RENA-VIT) TABS tablet Take 1 tablet by mouth daily.   Yes Historical Provider, MD    Allergies Iodine  No family history on file.  Social History Social History  Substance Use Topics  . Smoking status: Never Smoker  . Smokeless tobacco: Never Used  . Alcohol use No    Review of  Systems Constitutional: Positive for fever/chills Eyes: No visual changes. ENT: No sore throat. Cardiovascular: Denies chest pain. Respiratory: Denies shortness of breath. Gastrointestinal: No abdominal pain.  No nausea, no vomiting.  No diarrhea.  No constipation. Genitourinary: Negative for dysuria. Musculoskeletal: Negative for back pain. Skin: Negative for rash. Neurological: Negative for headaches, focal weakness or numbness.  10-point ROS otherwise negative.  ____________________________________________   PHYSICAL EXAM:  VITAL SIGNS: ED Triage Vitals  Enc Vitals Group     BP 05/27/16 0005 (!) 80/56     Pulse Rate 05/27/16 0005 (!) 140     Resp 05/27/16 0005 20     Temp 05/27/16 0005 (!) 100.9 F (38.3 C)     Temp Source 05/27/16 0005 Oral     SpO2 05/27/16 0005 97 %     Weight 05/27/16 0006 190 lb (86.2 kg)     Height 05/27/16 0006 6\' 3"  (1.905 m)     Head Circumference --      Peak Flow --      Pain Score 05/27/16 0006 0     Pain Loc --      Pain Edu? --      Excl. in GC? --     Constitutional: Alert and oriented. Well appearing and in no acute distress. Eyes: Conjunctivae are normal. PERRL. EOMI. Head: Atraumatic. Nose: No congestion/rhinnorhea. Mouth/Throat: Mucous membranes are moist.  Oropharynx non-erythematous. Neck: No stridor.  No meningeal signs.  No cervical spine tenderness to palpation.  Cardiovascular: Normal rate, regular rhythm. Good peripheral circulation. Grossly normal heart sounds. Respiratory: Normal respiratory effort.  No retractions. Lungs CTAB. Gastrointestinal: Soft and nontender. No distention.  Musculoskeletal: No lower extremity tenderness nor edema. No gross deformities of extremities. Neurologic:  Normal speech and language. No gross focal neurologic deficits are appreciated.  Skin:  Skin is warm, dry and intact. No rash noted. Psychiatric: Mood and affect are normal. Speech and behavior are  normal.  ____________________________________________   LABS (all labs ordered are listed, but only abnormal results are displayed)  Labs Reviewed  COMPREHENSIVE METABOLIC PANEL - Abnormal; Notable for the following:       Result Value   Sodium 130 (*)    Chloride 85 (*)    Glucose, Bld 121 (*)    BUN 66 (*)    Creatinine, Ser 17.76 (*)    GFR calc non Af Amer 3 (*)    GFR calc Af Amer 3 (*)    Anion gap 19 (*)    All other components within normal limits  CBC WITH DIFFERENTIAL/PLATELET - Abnormal; Notable for the following:    WBC 11.6 (*)    RDW 15.2 (*)    Platelets 131 (*)    Neutro Abs 9.9 (*)    Lymphs Abs 0.5 (*)    Monocytes Absolute 1.1 (*)    All other components within normal limits  CULTURE, BLOOD (ROUTINE X 2)  CULTURE, BLOOD (ROUTINE X 2)  LACTIC ACID, PLASMA  LACTIC ACID, PLASMA   ____________________________________________  EKG  ED ECG REPORT I, Leonard N Illene Sweeting, the attending physician, personally viewed and interpreted this ECG.   Date: 05/27/2016  EKG Time: 12:44 AM  Rate: 127  Rhythm: Sinus tachycardia  Axis: Normal  Intervals: Normal  ST&T Change: None  ____________________________________________  RADIOLOGY I, [place] N Hamdi Kley, personally viewed and evaluated these images (plain radiographs) as part of my medical decision making, as well as reviewing the written report by the radiologist.  Dg Chest 2 View  Result Date: 05/27/2016 CLINICAL DATA:  Acute onset of nausea and vomiting. Fever. Burning sensation at the patient's tunneled catheter. Initial encounter. EXAM: CHEST  2 VIEW COMPARISON:  None. FINDINGS: The lungs are well-aerated. Vascular congestion is noted. A right-sided dual-lumen catheter is noted ending about the distal SVC. There is no evidence of focal opacification, pleural effusion or pneumothorax. The heart is normal in size; the mediastinal contour is within normal limits. No acute osseous abnormalities are seen. IMPRESSION:  Vascular congestion noted. Lungs remain grossly clear. Right-sided dual-lumen catheter noted ending about the distal SVC. Electronically Signed   By: Roanna Raider M.D.   On: 05/27/2016 00:53     Procedures   Critical Care performed: CRITICAL CARE Performed by: Darci Current   Total critical care time: 40 minutes  Critical care time was exclusive of separately billable procedures and treating other patients.  Critical care was necessary to treat or prevent imminent or life-threatening deterioration.  Critical care was time spent personally by me on the following activities: development of treatment plan with patient and/or surrogate as well as nursing, discussions with consultants, evaluation of patient's response to treatment, examination of patient, obtaining history from patient or surrogate, ordering and performing treatments and interventions, ordering and review of laboratory studies, ordering and review of radiographic studies, pulse oximetry and re-evaluation of patient's condition. ____________________________________________   INITIAL IMPRESSION / ASSESSMENT AND PLAN / ED COURSE  Pertinent labs & imaging results that were available during my  care of the patient were reviewed by me and considered in my medical decision making (see chart for details).  History of physical exam consistent with sepsis most likely secondary to the patient's indwelling dialysis catheter. Patient given IV vancomycin and Zosyn the emergency department as well as IV normal saline. Patient discussed with Dr. Sheryle Hailiamond for Hospital admission for further evaluation and management.   Clinical Course    ____________________________________________  FINAL CLINICAL IMPRESSION(S) / ED DIAGNOSES  Final diagnoses:  Sepsis, due to unspecified organism [facility](HCC)     MEDICATIONS GIVEN DURING THIS VISIT:  Medications  piperacillin-tazobactam (ZOSYN) IVPB 3.375 g (3.375 g Intravenous New Bag/Given 05/27/16  0055)  vancomycin (VANCOCIN) IVPB 1000 mg/200 mL premix (1,000 mg Intravenous New Bag/Given 05/27/16 0055)  sodium chloride 0.9 % bolus 500 mL (500 mLs Intravenous New Bag/Given 05/27/16 0055)  baclofen (LIORESAL) tablet 10 mg (10 mg Oral Given 05/27/16 0050)  acetaminophen (TYLENOL) tablet 650 mg (650 mg Oral Given 05/27/16 0100)     NEW OUTPATIENT MEDICATIONS STARTED DURING THIS VISIT:  New Prescriptions   No medications on file    Modified Medications   No medications on file    Discontinued Medications   No medications on file     Note:  This document was prepared using Dragon voice recognition software and may include unintentional dictation errors.    Darci Currentandolph N Luster Hechler, MD 05/27/16 [phone]

## 2016-05-27 NOTE — Progress Notes (Signed)
Pre hd assessment  

## 2016-05-28 DIAGNOSIS — N186 End stage renal disease: Secondary | ICD-10-CM

## 2016-05-28 DIAGNOSIS — R7881 Bacteremia: Secondary | ICD-10-CM

## 2016-05-28 LAB — BLOOD CULTURE ID PANEL (REFLEXED)
ACINETOBACTER BAUMANNII: NOT DETECTED
CANDIDA ALBICANS: NOT DETECTED
CANDIDA GLABRATA: NOT DETECTED
CANDIDA KRUSEI: NOT DETECTED
Candida parapsilosis: NOT DETECTED
Candida tropicalis: NOT DETECTED
ENTEROBACTER CLOACAE COMPLEX: NOT DETECTED
ENTEROCOCCUS SPECIES: NOT DETECTED
ESCHERICHIA COLI: NOT DETECTED
Enterobacteriaceae species: NOT DETECTED
Haemophilus influenzae: NOT DETECTED
Klebsiella oxytoca: NOT DETECTED
Klebsiella pneumoniae: NOT DETECTED
LISTERIA MONOCYTOGENES: NOT DETECTED
Neisseria meningitidis: NOT DETECTED
PSEUDOMONAS AERUGINOSA: NOT DETECTED
Proteus species: NOT DETECTED
STREPTOCOCCUS AGALACTIAE: NOT DETECTED
STREPTOCOCCUS PNEUMONIAE: NOT DETECTED
Serratia marcescens: NOT DETECTED
Staphylococcus aureus (BCID): DETECTED — AB
Staphylococcus species: DETECTED — AB
Streptococcus pyogenes: NOT DETECTED
Streptococcus species: NOT DETECTED

## 2016-05-28 MED ORDER — LIDOCAINE HCL (PF) 1 % IJ SOLN
30.0000 mL | Freq: Once | INTRAMUSCULAR | Status: AC
  Administered 2016-05-28: 30 mL via INTRADERMAL
  Filled 2016-05-28: qty 30

## 2016-05-28 MED ORDER — OXYCODONE-ACETAMINOPHEN 5-325 MG PO TABS
1.0000 | ORAL_TABLET | Freq: Four times a day (QID) | ORAL | Status: DC | PRN
  Administered 2016-05-28 – 2016-05-29 (×3): 1 via ORAL
  Filled 2016-05-28 (×3): qty 1

## 2016-05-28 MED ORDER — CEFAZOLIN IN D5W 1 GM/50ML IV SOLN
1.0000 g | INTRAVENOUS | Status: DC
  Administered 2016-05-28 – 2016-05-31 (×4): 1 g via INTRAVENOUS
  Filled 2016-05-28 (×8): qty 50

## 2016-05-28 MED ORDER — LIDOCAINE HCL (PF) 1 % IJ SOLN
30.0000 mL | Freq: Once | INTRAMUSCULAR | Status: DC
  Filled 2016-05-28: qty 30

## 2016-05-28 NOTE — Consult Note (Signed)
[facility] Vascular Consult Note  MRN : [id number]  Ronnie Cox is a 50 y.o. (1965-10-28) male who presents with chief complaint of  Chief Complaint  Patient presents with  . Fever  .  History of Present Illness: I am asked to see the patient by Dr. Wynelle LinkKolluru from nephrology for bacteremia and an existing PermCath. He was not toxic appearing but did have fever on admission. He has a fistula which needs to be transposed for use and is not ready to be used immediately. He has been on dialysis for 9 years. He has the hiccups but otherwise no complaints today. His blood cultures grew back staph RES and his PermCath will need to be removed.   Current Facility-Administered Medications  Medication Dose Route Frequency Provider Last Rate Last Dose  . acetaminophen (TYLENOL) tablet 650 mg  650 mg Oral Q6H PRN Arnaldo NatalMichael S Diamond, MD   650 mg at 05/27/16 2142   Or  . acetaminophen (TYLENOL) suppository 650 mg  650 mg Rectal Q6H PRN Arnaldo NatalMichael S Diamond, MD      . aspirin EC tablet 81 mg  81 mg Oral Daily Arnaldo NatalMichael S Diamond, MD   81 mg at 05/27/16 1525  . baclofen (LIORESAL) tablet 10 mg  10 mg Oral Daily PRN Arnaldo NatalMichael S Diamond, MD      . calcium acetate (Phos Binder) [facility](PHOSLYRA) 667 MG/5ML oral solution 667 mg  667 mg Oral TID WC Arnaldo NatalMichael S Diamond, MD   667 mg at 05/27/16 1716  . chlorproMAZINE (THORAZINE) 12.5 mg in sodium chloride 0.9 % 25 mL IVPB  12.5 mg Intravenous Q8H PRN Arnaldo NatalMichael S Diamond, MD   12.5 mg at 05/27/16 2307  . diltiazem (CARDIZEM CD) 24 hr capsule 120 mg  120 mg Oral Daily Arnaldo NatalMichael S Diamond, MD   120 mg at 05/27/16 1525  . docusate sodium (COLACE) capsule 100 mg  100 mg Oral BID Arnaldo NatalMichael S Diamond, MD   100 mg at 05/27/16 2134  . heparin injection 5,000 Units  5,000 Units Subcutaneous Q8H Arnaldo NatalMichael S Diamond, MD   5,000 Units at 05/28/16 0518  . lidocaine (PF) (XYLOCAINE) 1 % injection 30 mL  30 mL Intradermal Once Annice NeedyJason S Berman Grainger, MD      . lidocaine (PF) (XYLOCAINE)  1 % injection 30 mL  30 mL Intradermal Once Annice NeedyJason S Brinden Kincheloe, MD      . multivitamin (RENA-VIT) tablet 1 tablet  1 tablet Oral Daily Arnaldo NatalMichael S Diamond, MD   1 tablet at 05/27/16 1525  . ondansetron (ZOFRAN) tablet 4 mg  4 mg Oral Q6H PRN Arnaldo NatalMichael S Diamond, MD       Or  . ondansetron [facility](ZOFRAN) injection 4 mg  4 mg Intravenous Q6H PRN Arnaldo NatalMichael S Diamond, MD      . piperacillin-tazobactam (ZOSYN) IVPB 3.375 g  3.375 g Intravenous Q12H Darci Currentandolph N Brown, MD   3.375 g at 05/28/16 0005  . sodium chloride 0.9 % bolus 500 mL  500 mL Intravenous PRN Arnaldo NatalMichael S Diamond, MD      . sodium chloride flush (NS) 0.9 % injection 3 mL  3 mL Intravenous Q12H Arnaldo NatalMichael S Diamond, MD   3 mL at 05/27/16 2307  . vancomycin (VANCOCIN) IVPB 1000 mg/200 mL premix  1,000 mg Intravenous Q T,Th,Sa-HD Darci Currentandolph N Brown, MD   1,000 mg at 05/27/16 1405    Past Medical History:  Diagnosis Date  . Renal disorder     Past Surgical History:  Procedure  Laterality Date  . TUNNELED VENOUS PORT PLACEMENT    Multiple previous dialysis access placements   Social History Social History  Substance Use Topics  . Smoking status: Never Smoker  . Smokeless tobacco: Never Used  . Alcohol use No    Family History Family History  Problem Relation Age of Onset  . Renal Disease Mother   No bleeding disorders, clotting disorders, or autoimmune diseases  Allergies  Allergen Reactions  . Iodine Anaphylaxis     REVIEW OF SYSTEMS (Negative unless checked)  Constitutional: [] Weight loss  [x] Fever  [x] Chills Cardiac: [] Chest pain   [] Chest pressure   [] Palpitations   [] Shortness of breath when laying flat   [] Shortness of breath at rest   [] Shortness of breath with exertion. Vascular:  [] Pain in legs with walking   [] Pain in legs at rest   [] Pain in legs when laying flat   [] Claudication   [] Pain in feet when walking  [] Pain in feet at rest  [] Pain in feet when laying flat   [] History of DVT   [] Phlebitis   [] Swelling in legs   [] Varicose  veins   [] Non-healing ulcers Pulmonary:   [] Uses home oxygen   [] Productive cough   [] Hemoptysis   [] Wheeze  [] COPD   [] Asthma Neurologic:  [] Dizziness  [] Blackouts   [] Seizures   [] History of stroke   [] History of TIA  [] Aphasia   [] Temporary blindness   [] Dysphagia   [] Weakness or numbness in arms   [] Weakness or numbness in legs Musculoskeletal:  [] Arthritis   [] Joint swelling   [] Joint pain   [] Low back pain Hematologic:  [] Easy bruising  [] Easy bleeding   [] Hypercoagulable state   [] Anemic  [] Hepatitis Gastrointestinal:  [] Blood in stool   [] Vomiting blood  [] Gastroesophageal reflux/heartburn   [] Difficulty swallowing. Genitourinary:  [x] Chronic kidney disease   [] Difficult urination  [] Frequent urination  [] Burning with urination   [] Blood in urine Skin:  [] Rashes   [] Ulcers   [] Wounds Psychological:  [] History of anxiety   []  History of major depression.  Physical Examination  Vitals:   05/27/16 1531 05/27/16 2049 05/28/16 0500 05/28/16 0502  BP: (!) 107/53 (!) 109/50  (!) 113/52  Pulse: (!) 113 (!) 101  99  Resp: 18 17  17   Temp: 98.5 F (36.9 C) 98.6 F (37 C)  98.2 F (36.8 C)  TempSrc: Oral Oral  Oral  SpO2: 93% 99%  92%  Weight:   86.5 kg (190 lb 12.8 oz)   Height:       Body mass index is 23.85 kg/m. Gen:  WD/WN, NAD Head: [place]/AT, No temporalis wasting. Prominent temp pulse not noted. Ear/Nose/Throat: Hearing grossly intact, nares w/o erythema or drainage, oropharynx w/o Erythema/Exudate Eyes: Sclera non-icteric, conjunctiva clear Neck: Trachea midline.  No JVD.  Pulmonary:  Good air movement, respirations not labored, equal bilaterally.  Cardiac: RRR, normal S1, S2. Vascular: PermCath exiting the right chest without erythema or drainage. Good thrill and bruit in his left brachiobasilic AV fistula Vessel Right Left  Radial Palpable Palpable                                   Gastrointestinal: soft, non-tender/non-distended. No guarding/reflex.   Musculoskeletal:Extremities without ischemic changes.  No deformity or atrophy. No edema. Neurologic: Sensation grossly intact in extremities.  Symmetrical.  Speech is fluent.  Psychiatric: Judgment intact, Mood & affect appropriate for pt's clinical situation. Dermatologic: No rashes or  ulcers noted.  No cellulitis or open wounds. Lymph : No Cervical, Axillary, or Inguinal lymphadenopathy.      CBC Lab Results  Component Value Date   WBC 11.6 (H) 05/27/2016   HGB 14.4 05/27/2016   HCT 42.9 05/27/2016   MCV 91.0 05/27/2016   PLT 131 (L) 05/27/2016    BMET    Component Value Date/Time   NA 130 (L) 05/27/2016 0018   K 4.8 05/27/2016 0018   CL 85 (L) 05/27/2016 0018   CO2 26 05/27/2016 0018   GLUCOSE 121 (H) 05/27/2016 0018   BUN 66 (H) 05/27/2016 0018   CREATININE 17.76 (H) 05/27/2016 0018   CALCIUM 9.0 05/27/2016 0018   GFRNONAA 3 (L) 05/27/2016 0018   GFRAA 3 (L) 05/27/2016 0018   Estimated Creatinine Clearance: 6.1 mL/min (by C-G formula based on SCr of 17.76 mg/dL (H)).  COAG No results found for: INR, PROTIME  Radiology Dg Chest 2 View  Result Date: 05/27/2016 CLINICAL DATA:  Acute onset of nausea and vomiting. Fever. Burning sensation at the patient's tunneled catheter. Initial encounter. EXAM: CHEST  2 VIEW COMPARISON:  None. FINDINGS: The lungs are well-aerated. Vascular congestion is noted. A right-sided dual-lumen catheter is noted ending about the distal SVC. There is no evidence of focal opacification, pleural effusion or pneumothorax. The heart is normal in size; the mediastinal contour is within normal limits. No acute osseous abnormalities are seen. IMPRESSION: Vascular congestion noted. Lungs remain grossly clear. Right-sided dual-lumen catheter noted ending about the distal SVC. Electronically Signed   By: Roanna Raider M.D.   On: 05/27/2016 00:53      Assessment/Plan 1. Bacteremia with PermCath in place which is the likely source. PermCath will need  to be removed. Risks and benefits discussed. 2.  End-stage renal disease. Will need to have a PermCath replaced once his blood cultures have cleared. He reports that his left brachiobasilic AV fistula will need to be transposed first, which is typical before it can be used. This fistula is now about 2 months old and I think that can be done at any time.    Festus Barren, MD  05/28/2016 9:56 AM    This note was created with Dragon medical transcription system.  Any error is purely unintentional

## 2016-05-28 NOTE — Progress Notes (Signed)
PHARMACY - PHYSICIAN COMMUNICATION CRITICAL VALUE ALERT - BLOOD CULTURE IDENTIFICATION (BCID)  Results for orders placed or performed during the hospital encounter of 05/27/16  Blood Culture ID Panel (Reflexed) (Collected: 05/27/2016 12:18 AM)  Result Value Ref Range   Enterococcus species NOT DETECTED NOT DETECTED   Listeria monocytogenes NOT DETECTED NOT DETECTED   Staphylococcus species DETECTED (A) NOT DETECTED   Staphylococcus aureus DETECTED (A) NOT DETECTED   Streptococcus species NOT DETECTED NOT DETECTED   Streptococcus agalactiae NOT DETECTED NOT DETECTED   Streptococcus pneumoniae NOT DETECTED NOT DETECTED   Streptococcus pyogenes NOT DETECTED NOT DETECTED   Acinetobacter baumannii NOT DETECTED NOT DETECTED   Enterobacteriaceae species NOT DETECTED NOT DETECTED   Enterobacter cloacae complex NOT DETECTED NOT DETECTED   Escherichia coli NOT DETECTED NOT DETECTED   Klebsiella oxytoca NOT DETECTED NOT DETECTED   Klebsiella pneumoniae NOT DETECTED NOT DETECTED   Proteus species NOT DETECTED NOT DETECTED   Serratia marcescens NOT DETECTED NOT DETECTED   Haemophilus influenzae NOT DETECTED NOT DETECTED   Neisseria meningitidis NOT DETECTED NOT DETECTED   Pseudomonas aeruginosa NOT DETECTED NOT DETECTED   Candida albicans NOT DETECTED NOT DETECTED   Candida glabrata NOT DETECTED NOT DETECTED   Candida krusei NOT DETECTED NOT DETECTED   Candida parapsilosis NOT DETECTED NOT DETECTED   Candida tropicalis NOT DETECTED NOT DETECTED    Name of physician (or Provider) Contacted: Dr. Auburn BilberryShreyang Patel  Recommendation: To narrow therapy based on BCID results showing staph aureus (MecA negative). Recommendation to switch to cefazolin or D/C of vancomycin    Changes to prescribed antibiotics required: MD does not want to make Abx changes at this time. Will f/U with rounding MD in AM.   11/5 Dr Imogene Burnhen agreed to changing antibiotics to Cefazolin.  Ordered Cefazolin 1g IV  q24H.  Rula Keniston K 05/28/2016  1:32 PM

## 2016-05-28 NOTE — Progress Notes (Signed)
[facility]  ROUNDING NOTE   Subjective:   Hemodialysis yesterday. Tolerated treatment well. UF of 1.5 litres  Blood cultures are growing staph aureus  Objective:  Vital signs in last 24 hours:  Temp:  [98.2 F (36.8 C)-99 F (37.2 C)] 98.2 F (36.8 C) (11/05 0502) Pulse Rate:  [91-113] 99 (11/05 0502) Resp:  [13-21] 17 (11/05 0502) BP: (104-138)/(50-75) 113/52 (11/05 0502) SpO2:  [90 %-100 %] 92 % (11/05 0502) Weight:  [86.5 kg (190 lb 12.8 oz)-88.3 kg (194 lb 10.7 oz)] 86.5 kg (190 lb 12.8 oz) (11/05 0500)  Weight change: 2.117 kg (4 lb 10.7 oz) Filed Weights   05/27/16 1145 05/27/16 1450 05/28/16 0500  Weight: 88.3 kg (194 lb 10.7 oz) 86.7 kg (191 lb 2.2 oz) 86.5 kg (190 lb 12.8 oz)    Intake/Output: I/O last 3 completed shifts: In: 675 [P.O.:600; IV Piggyback:75] Out: 1500 [Other:1500]   Intake/Output this shift:  No intake/output data recorded.  Physical Exam: General: NAD  Head: Normocephalic, atraumatic. Moist oral mucosal membranes  Eyes: Anicteric, PERRL  Neck: Supple, trachea midline  Lungs:  Clear to auscultation  Heart: Regular rate and rhythm  Abdomen:  Soft, nontender,   Extremities: no peripheral edema.  Neurologic: Nonfocal, moving all four extremities  Skin: No lesions  Access: Maturing left AVF +bruit and thrill, RIJ permcath    Basic Metabolic Panel:  Recent Labs Lab 05/27/16 0018  NA 130*  K 4.8  CL 85*  CO2 26  GLUCOSE 121*  BUN 66*  CREATININE 17.76*  CALCIUM 9.0    Liver Function Tests:  Recent Labs Lab 05/27/16 0018  AST 38  ALT 44  ALKPHOS 45  BILITOT 0.7  PROT 7.7  ALBUMIN 3.8   No results for input(s): LIPASE, AMYLASE in the last 168 hours. No results for input(s): AMMONIA in the last 168 hours.  CBC:  Recent Labs Lab 05/27/16 0018  WBC 11.6*  NEUTROABS 9.9*  HGB 14.4  HCT 42.9  MCV 91.0  PLT 131*    Cardiac Enzymes: No results for input(s): CKTOTAL, CKMB, CKMBINDEX, TROPONINI in the  last 168 hours.  BNP: Invalid input(s): POCBNP  CBG: No results for input(s): GLUCAP in the last 168 hours.  Microbiology: Results for orders placed or performed during the hospital encounter of 05/27/16  Culture, blood (Routine x 2)     Status: None (Preliminary result)   Collection Time: 05/27/16 12:18 AM  Result Value Ref Range Status   Specimen Description BLOOD BLOOD RIGHT FOREARM  Final   Special Requests BOTTLES DRAWN AEROBIC AND ANAEROBIC 7CCAERO,7CCANA  Final   Culture  Setup Time   Final    Organism ID to follow GRAM POSITIVE COCCI ANAEROBIC BOTTLE ONLY CRITICAL RESULT CALLED TO, READ BACK BY AND VERIFIED WITH: SHEEMA HALLAJI AT 1913 ON 05/27/16.Marland Kitchen.Mar[facility] Kitchen.[facility]    Culture   Final    GRAM POSITIVE COCCI CULTURE REINCUBATED FOR BETTER GROWTH Performed at [facility]    Report Status PENDING  Incomplete  Culture, blood (Routine x 2)     Status: None (Preliminary result)   Collection Time: 05/27/16 12:18 AM  Result Value Ref Range Status   Specimen Description BLOOD RIGHT FOREARM LATERAL  Final   Special Requests   Final    BOTTLES DRAWN AEROBIC AND ANAEROBIC 12CCAERO,9CCANA   Culture  Setup Time   Final    GRAM POSITIVE COCCI ANAEROBIC BOTTLE ONLY CRITICAL VALUE NOTED.  VALUE IS CONSISTENT WITH PREVIOUSLY REPORTED AND CALLED VALUE. CONFIRMED  BY [facility]    Culture GRAM POSITIVE COCCI  Final   Report Status PENDING  Incomplete  Blood Culture ID Panel (Reflexed)     Status: Abnormal   Collection Time: 05/27/16 12:18 AM  Result Value Ref Range Status   Enterococcus species NOT DETECTED NOT DETECTED Final   Listeria monocytogenes NOT DETECTED NOT DETECTED Final   Staphylococcus species DETECTED (A) NOT DETECTED Corrected    Comment: CRITICAL RESULT CALLED TO, READ BACK BY AND VERIFIED WITH: SHEEMA HALLAJI AT 1913 ON 05/27/16.Marland Kitchen.Mar[facility] Kitchen.[facility] CORRECTED ON 11/05 AT 0659: PREVIOUSLY REPORTED AS DETECTED RBV SHEEMA HALLAJI AT 1913 ON 05/27/16.Marland Kitchen.Mar[facility] Kitchen.[facility]    Staphylococcus aureus DETECTED (A)  NOT DETECTED Corrected    Comment: CRITICAL RESULT CALLED TO, READ BACK BY AND VERIFIED WITH: SHEEMA HALLAJI AT 1913 ON 05/27/16.Marland Kitchen.[facility] Kitchen.[facility] CORRECTED ON 11/05 AT 0659: PREVIOUSLY REPORTED AS DETECTED RBV SHEEMA HALLAJI AT 1913 ON 05/27/16.Marland Kitchen.[facility] Kitchen.[facility]    Streptococcus species NOT DETECTED NOT DETECTED Final   Streptococcus agalactiae NOT DETECTED NOT DETECTED Final   Streptococcus pneumoniae NOT DETECTED NOT DETECTED Final   Streptococcus pyogenes NOT DETECTED NOT DETECTED Final   Acinetobacter baumannii NOT DETECTED NOT DETECTED Final   Enterobacteriaceae species NOT DETECTED NOT DETECTED Final   Enterobacter cloacae complex NOT DETECTED NOT DETECTED Final   Escherichia coli NOT DETECTED NOT DETECTED Final   Klebsiella oxytoca NOT DETECTED NOT DETECTED Final   Klebsiella pneumoniae NOT DETECTED NOT DETECTED Final   Proteus species NOT DETECTED NOT DETECTED Final   Serratia marcescens NOT DETECTED NOT DETECTED Final   Haemophilus influenzae NOT DETECTED NOT DETECTED Final   Neisseria meningitidis NOT DETECTED NOT DETECTED Final   Pseudomonas aeruginosa NOT DETECTED NOT DETECTED Final   Candida albicans NOT DETECTED NOT DETECTED Final   Candida glabrata NOT DETECTED NOT DETECTED Final   Candida krusei NOT DETECTED NOT DETECTED Final   Candida parapsilosis NOT DETECTED NOT DETECTED Final   Candida tropicalis NOT DETECTED NOT DETECTED Final  MRSA PCR Screening     Status: Abnormal   Collection Time: 05/27/16  4:02 AM  Result Value Ref Range Status   MRSA by PCR (A) NEGATIVE Final    INVALID, UNABLE TO DETERMINE THE PRESENCE OF TARGET DNA DUE TO SPECIMEN INTEGRITY. RECOLLECTION REQUESTED.    Comment: CALLED DAWN SONGSCER ON 05/27/16 AT 0715 BY [facility]  Culture, blood (routine x 2)     Status: None (Preliminary result)   Collection Time: 05/27/16 11:18 AM  Result Value Ref Range Status   Specimen Description BLOOD RIGHT ARM  Final   Special Requests BOTTLES DRAWN AEROBIC AND ANAEROBIC 9CC  Final    Culture NO GROWTH < 24 HOURS  Final   Report Status PENDING  Incomplete  Culture, blood (routine x 2)     Status: None (Preliminary result)   Collection Time: 05/27/16 11:18 AM  Result Value Ref Range Status   Specimen Description BLOOD RIGHT HAND  Final   Special Requests BOTTLES DRAWN AEROBIC AND ANAEROBIC 8CC  Final   Culture NO GROWTH < 24 HOURS  Final   Report Status PENDING  Incomplete    Coagulation Studies: No results for input(s): LABPROT, INR in the last 72 hours.  Urinalysis: No results for input(s): COLORURINE, LABSPEC, PHURINE, GLUCOSEU, HGBUR, BILIRUBINUR, KETONESUR, PROTEINUR, UROBILINOGEN, NITRITE, LEUKOCYTESUR in the last 72 hours.  Invalid input(s): APPERANCEUR    Imaging: Dg Chest 2 View  Result Date: 05/27/2016 CLINICAL DATA:  Acute onset of nausea and vomiting. Fever. Burning sensation at the patient's tunneled catheter.  Initial encounter. EXAM: CHEST  2 VIEW COMPARISON:  None. FINDINGS: The lungs are well-aerated. Vascular congestion is noted. A right-sided dual-lumen catheter is noted ending about the distal SVC. There is no evidence of focal opacification, pleural effusion or pneumothorax. The heart is normal in size; the mediastinal contour is within normal limits. No acute osseous abnormalities are seen. IMPRESSION: Vascular congestion noted. Lungs remain grossly clear. Right-sided dual-lumen catheter noted ending about the distal SVC. Electronically Signed   By: Roanna Raider M.D.   On: 05/27/2016 00:53     Medications:    . aspirin EC  81 mg Oral Daily  . calcium acetate (Phos Binder)  667 mg Oral TID WC  . diltiazem  120 mg Oral Daily  . docusate sodium  100 mg Oral BID  . heparin  5,000 Units Subcutaneous Q8H  . lidocaine (PF)  30 mL Intradermal Once  . lidocaine (PF)  30 mL Intradermal Once  . multivitamin  1 tablet Oral Daily  . piperacillin-tazobactam (ZOSYN)  IV  3.375 g Intravenous Q12H  . sodium chloride flush  3 mL Intravenous Q12H  .  vancomycin  1,000 mg Intravenous Q T,Th,Sa-HD   acetaminophen **OR** acetaminophen, baclofen, chlorproMAZINE (THORAZINE) IV, ondansetron **OR** ondansetron (ZOFRAN) IV, sodium chloride  Assessment/ Plan:  Mr. Ronnie Cox is a 50 y.o. black male with hypertension, end stage renal disease on hemodialysis.   TTS [facility] [address]   1. End Stage Renal Disease: with complication of dialysis device.  Positive blood cultures as outpatient. 11/2 growing staph aureus.  - Vancomycin and zosyn - Vascular consulted to have catheter removed. He will need a catheter free period.  Hopefully his AVF will be ready to use, if not will need a new tunneled catheter.   2. Hypertension: hypotension due to fever/sepsis - hold home medications.   3. Secondary Hyperparathyroidism with hyperphosphatemia: outpatient PTH 560, phosphorus 6.4 - calcium acetate.   4. Anemia of chronic kidney disease: hemoglobin at goal. No indication for epo.    LOS: 1 Rohin Krejci 11/5/20179:46 AM

## 2016-05-28 NOTE — Op Note (Signed)
Operative Note     Preoperative diagnosis:   1. ESRD with bacteremia and likely permcath infection  Postoperative diagnosis:  1. Same as above  Procedure:  Removal of right jugular Permcath  Surgeon:  Festus BarrenJason Trishia Cuthrell, MD  Anesthesia:  Local  EBL:  Minimal  Indication for the Procedure:  The patient has end-stage renal disease and has developed bacteremia with staph aureus. His PermCath will have to be removed.  Risks and benefits are discussed and informed consent is obtained.  Description of the Procedure:  The patient's right neck, chest and existing catheter were sterilely prepped and draped. The area around the catheter was anesthetized copiously with 1% lidocaine. The catheter was dissected out with curved hemostats until the cuff was freed from the surrounding fibrous sheath. The fiber sheath was transected, and the catheter was then removed in its entirety using gentle traction. Pressure was held and sterile dressings were placed. The patient tolerated the procedure well and was taken to the recovery room in stable condition.     Festus BarrenJason Kaladin Noseworthy  05/28/2016, 10:00 AM This note was created with Dragon Medical transcription system. Any errors in dictation are purely unintentional.

## 2016-05-28 NOTE — Progress Notes (Signed)
[facility] [place] [facility]   PATIENT NAME: Ronnie Cox    MR#:  [id number]  DATE OF BIRTH:  [birth year]  SUBJECTIVE:  CHIEF COMPLAINT:   Chief Complaint  Patient presents with  . Fever   No complaint. S/p removal of Dialysis catheter today. REVIEW OF SYSTEMS:  Review of Systems  Constitutional: Negative for chills, fever and malaise/fatigue.  Eyes: Negative for blurred vision and double vision.  Respiratory: Negative for cough, shortness of breath, wheezing and stridor.   Cardiovascular: Negative for chest pain and leg swelling.  Gastrointestinal: Negative for abdominal pain, diarrhea, nausea and vomiting.  Genitourinary: Negative for dysuria.  Musculoskeletal: Negative for joint pain.  Skin: Negative for rash.  Neurological: Negative for dizziness, focal weakness, loss of consciousness, weakness and headaches.  Psychiatric/Behavioral: Negative for depression. The patient is not nervous/anxious.     DRUG ALLERGIES:   Allergies  Allergen Reactions  . Iodine Anaphylaxis   VITALS:  Blood pressure 107/61, pulse 98, temperature 98 F (36.7 C), temperature source Oral, resp. rate 17, height 6\' 3"  (1.905 m), weight 190 lb 12.8 oz (86.5 kg), SpO2 98 %. PHYSICAL EXAMINATION:  Physical Exam  Constitutional: He is oriented to person, place, and time and well-developed, well-nourished, and in no distress.  HENT:  Mouth/Throat: Oropharynx is clear and moist.  Eyes: Conjunctivae and EOM are normal.  Neck: Normal range of motion. Neck supple. No JVD present. No tracheal deviation present.  Cardiovascular: Normal rate, regular rhythm and normal heart sounds.  Exam reveals no gallop.   No murmur heard. Pulmonary/Chest: Effort normal and breath sounds normal. No respiratory distress. He has no wheezes. He has no rales.  Abdominal: Soft. Bowel sounds are normal. He exhibits no distension. There is no tenderness.  Musculoskeletal: Normal range of motion. He  exhibits no edema.  Neurological: He is alert and oriented to person, place, and time. No cranial nerve deficit.  Skin: No rash noted. No erythema.  Psychiatric: Affect and judgment normal.   LABORATORY PANEL:   CBC  Recent Labs Lab 05/27/16 0018  WBC 11.6*  HGB 14.4  HCT 42.9  PLT 131*   ------------------------------------------------------------------------------------------------------------------ Chemistries   Recent Labs Lab 05/27/16 0018  NA 130*  K 4.8  CL 85*  CO2 26  GLUCOSE 121*  BUN 66*  CREATININE 17.76*  CALCIUM 9.0  AST 38  ALT 44  ALKPHOS 45  BILITOT 0.7   RADIOLOGY:  No results found. ASSESSMENT AND PLAN:   This is a 50 year old male admitted for sepsis. 1. Sepsis with staph aureus bacteremia. Likely source of infection is tunneled dialysis catheter. His outpatient blood cultures show staph aureus per Dr. Wynelle LinkKolluru.  disontinue vancomycin and Zosyn, start cefazolin. Follow up inpatient blood culture. Removed dialysis catheter today per Dr. Wynelle LinkKolluru.  ID consult.  2. End-stage renal disease: Remove dialysis catheter after hemodialysis today per Dr. Wynelle LinkKolluru.  3. Hiccups: Started following vancomycin which the patient states is a common side effect for him. He received baclofen in the emergency department without relief. On a small dose of Thorazine.  Hyponatremia. Adjust during hemodialysis.  I discussed with Dr. Wynelle LinkKolluru  All the records are reviewed and case discussed with Care Management/Social Worker. Management plans discussed with the patient, family and they are in agreement.  CODE STATUS: Full code  TOTAL TIME TAKING CARE OF THIS PATIENT: 28 minutes.   More than 50% of the time was spent in counseling/coordination of care: YES  POSSIBLE D/C IN 2 DAYS,  DEPENDING ON CLINICAL CONDITION.   Shaune Pollackhen, Evva Din M.D on 05/28/2016 at 2:00 PM  Between 7am to 6pm - Pager - [phone]  After 6pm go to [email or web address] - Social research officer, governmentpassword EPAS [facility]  [facility] [place] [facility]  Office  [phone]  CC: Primary care physician; Pcp Not In System  Note: This dictation was prepared with Dragon dictation along with smaller phrase technology. Any transcriptional errors that result from this process are unintentional.

## 2016-05-28 NOTE — Progress Notes (Signed)
Dr. Imogene Burnhen notified of pain in right chest area where catheter was removed. Percocet 1 tab q6 prn ordered.

## 2016-05-29 ENCOUNTER — Inpatient Hospital Stay
Admit: 2016-05-29 | Discharge: 2016-05-29 | Disposition: A | Discharge status: Home / Self Care | Payer: Medicare Other | Attending: Infectious Diseases | Admitting: Infectious Diseases

## 2016-05-29 LAB — BASIC METABOLIC PANEL
Anion gap: 17 — ABNORMAL HIGH (ref 5–15)
BUN: 61 mg/dL — AB (ref 6–20)
CHLORIDE: 92 mmol/L — AB (ref 101–111)
CO2: 25 mmol/L (ref 22–32)
CREATININE: 18.53 mg/dL — AB (ref 0.61–1.24)
Calcium: 8.6 mg/dL — ABNORMAL LOW (ref 8.9–10.3)
GFR calc Af Amer: 3 mL/min — ABNORMAL LOW (ref >60)
GFR, EST NON AFRICAN AMERICAN: 3 mL/min — AB (ref >60)
Glucose, Bld: 89 mg/dL (ref 65–99)
Potassium: 4.7 mmol/L (ref 3.5–5.1)
SODIUM: 134 mmol/L — AB (ref 135–145)

## 2016-05-29 LAB — MRSA PCR SCREENING: MRSA by PCR: NEGATIVE

## 2016-05-29 LAB — CBC
HCT: 36.7 % — ABNORMAL LOW (ref 40.0–52.0)
Hemoglobin: 12.5 g/dL — ABNORMAL LOW (ref 13.0–18.0)
MCH: 31.1 pg (ref 26.0–34.0)
MCHC: 33.9 g/dL (ref 32.0–36.0)
MCV: 91.6 fL (ref 80.0–100.0)
PLATELETS: 146 10*3/uL — AB (ref 150–440)
RBC: 4.01 MIL/uL — ABNORMAL LOW (ref 4.40–5.90)
RDW: 15 % — AB (ref 11.5–14.5)
WBC: 6.6 10*3/uL (ref 3.8–10.6)

## 2016-05-29 LAB — HEMOGLOBIN A1C
Hgb A1c MFr Bld: 5.7 % — ABNORMAL HIGH (ref 4.8–5.6)
Mean Plasma Glucose: 117 mg/dL

## 2016-05-29 LAB — ECHOCARDIOGRAM COMPLETE
HEIGHTINCHES: 75 in
Weight: 3052.8 oz

## 2016-05-29 NOTE — Progress Notes (Signed)
[facility]  ROUNDING NOTE   Subjective:  Was removed yesterday. Patient feeling well at the moment. Has been ambulating on the ward.  Objective:  Vital signs in last 24 hours:  Temp:  [97.9 F (36.6 C)-98 F (36.7 C)] 98 F (36.7 C) (11/06 1300) Pulse Rate:  [87-96] 88 (11/06 1300) Resp:  [18-19] 19 (11/06 1300) BP: (109-148)/(71-97) 144/78 (11/06 1300) SpO2:  [98 %-100 %] 99 % (11/06 1300)  Weight change:  Filed Weights   05/27/16 1145 05/27/16 1450 05/28/16 0500  Weight: 88.3 kg (194 lb 10.7 oz) 86.7 kg (191 lb 2.2 oz) 86.5 kg (190 lb 12.8 oz)    Intake/Output: I/O last 3 completed shifts: In: 1090 [P.O.:940; IV Piggyback:150] Out: 0    Intake/Output this shift:  No intake/output data recorded.  Physical Exam: General: NAD  Head: Normocephalic, atraumatic. Moist oral mucosal membranes  Eyes: Anicteric  Neck: Supple, trachea midline  Lungs:  Clear to auscultation  Heart: S1S2 no rubs  Abdomen:  Soft, nontender, bowel sounds present  Extremities: no peripheral edema.  Neurologic: Nonfocal, moving all four extremities  Skin: No lesions  Access: Maturing left AVF +bruit and thrill, RIJ permcath removed    Basic Metabolic Panel:  Recent Labs Lab 05/27/16 0018 05/29/16 0615  NA 130* 134*  K 4.8 4.7  CL 85* 92*  CO2 26 25  GLUCOSE 121* 89  BUN 66* 61*  CREATININE 17.76* 18.53*  CALCIUM 9.0 8.6*    Liver Function Tests:  Recent Labs Lab 05/27/16 0018  AST 38  ALT 44  ALKPHOS 45  BILITOT 0.7  PROT 7.7  ALBUMIN 3.8   No results for input(s): LIPASE, AMYLASE in the last 168 hours. No results for input(s): AMMONIA in the last 168 hours.  CBC:  Recent Labs Lab 05/27/16 0018 05/29/16 0615  WBC 11.6* 6.6  NEUTROABS 9.9*  --   HGB 14.4 12.5*  HCT 42.9 36.7*  MCV 91.0 91.6  PLT 131* 146*    Cardiac Enzymes: No results for input(s): CKTOTAL, CKMB, CKMBINDEX, TROPONINI in the last 168 hours.  BNP: Invalid input(s):  POCBNP  CBG: No results for input(s): GLUCAP in the last 168 hours.  Microbiology: Results for orders placed or performed during the hospital encounter of 05/27/16  Culture, blood (Routine x 2)     Status: Abnormal (Preliminary result)   Collection Time: 05/27/16 12:18 AM  Result Value Ref Range Status   Specimen Description BLOOD BLOOD RIGHT FOREARM  Final   Special Requests BOTTLES DRAWN AEROBIC AND ANAEROBIC 7CCAERO,7CCANA  Final   Culture  Setup Time   Final    GRAM POSITIVE COCCI ANAEROBIC BOTTLE ONLY CRITICAL RESULT CALLED TO, READ BACK BY AND VERIFIED WITH: SHEEMA HALLAJI AT 1913 ON 05/27/16.Marland Kitchen.[facility]    Culture (A)  Final    STAPHYLOCOCCUS AUREUS SUSCEPTIBILITIES TO FOLLOW Performed at [facility]    Report Status PENDING  Incomplete  Culture, blood (Routine x 2)     Status: Abnormal (Preliminary result)   Collection Time: 05/27/16 12:18 AM  Result Value Ref Range Status   Specimen Description BLOOD RIGHT FOREARM LATERAL  Final   Special Requests   Final    BOTTLES DRAWN AEROBIC AND ANAEROBIC 12CCAERO,9CCANA   Culture  Setup Time   Final    GRAM POSITIVE COCCI ANAEROBIC BOTTLE ONLY CRITICAL VALUE NOTED.  VALUE IS CONSISTENT WITH PREVIOUSLY REPORTED AND CALLED VALUE. CONFIRMED BY [facility]    Culture STAPHYLOCOCCUS AUREUS (A)  Final   Report  Status PENDING  Incomplete  Blood Culture ID Panel (Reflexed)     Status: Abnormal   Collection Time: 05/27/16 12:18 AM  Result Value Ref Range Status   Enterococcus species NOT DETECTED NOT DETECTED Final   Listeria monocytogenes NOT DETECTED NOT DETECTED Final   Staphylococcus species DETECTED (A) NOT DETECTED Corrected    Comment: CRITICAL RESULT CALLED TO, READ BACK BY AND VERIFIED WITH: SHEEMA HALLAJI AT 1913 ON 05/27/16.Marland Kitchen.[facility] CORRECTED ON 11/05 AT 0659: PREVIOUSLY REPORTED AS DETECTED RBV SHEEMA HALLAJI AT 1913 ON 05/27/16.Marland Kitchen.[facility]    Staphylococcus aureus DETECTED (A) NOT DETECTED Corrected    Comment: CRITICAL RESULT CALLED  TO, READ BACK BY AND VERIFIED WITH: SHEEMA HALLAJI AT 1913 ON 05/27/16.Marland Kitchen.[facility] CORRECTED ON 11/05 AT 0659: PREVIOUSLY REPORTED AS DETECTED RBV SHEEMA HALLAJI AT 1913 ON 05/27/16.Marland Kitchen.[facility] Kitchen.[facility]    Streptococcus species NOT DETECTED NOT DETECTED Final   Streptococcus agalactiae NOT DETECTED NOT DETECTED Final   Streptococcus pneumoniae NOT DETECTED NOT DETECTED Final   Streptococcus pyogenes NOT DETECTED NOT DETECTED Final   Acinetobacter baumannii NOT DETECTED NOT DETECTED Final   Enterobacteriaceae species NOT DETECTED NOT DETECTED Final   Enterobacter cloacae complex NOT DETECTED NOT DETECTED Final   Escherichia coli NOT DETECTED NOT DETECTED Final   Klebsiella oxytoca NOT DETECTED NOT DETECTED Final   Klebsiella pneumoniae NOT DETECTED NOT DETECTED Final   Proteus species NOT DETECTED NOT DETECTED Final   Serratia marcescens NOT DETECTED NOT DETECTED Final   Haemophilus influenzae NOT DETECTED NOT DETECTED Final   Neisseria meningitidis NOT DETECTED NOT DETECTED Final   Pseudomonas aeruginosa NOT DETECTED NOT DETECTED Final   Candida albicans NOT DETECTED NOT DETECTED Final   Candida glabrata NOT DETECTED NOT DETECTED Final   Candida krusei NOT DETECTED NOT DETECTED Final   Candida parapsilosis NOT DETECTED NOT DETECTED Final   Candida tropicalis NOT DETECTED NOT DETECTED Final  MRSA PCR Screening     Status: Abnormal   Collection Time: 05/27/16  4:02 AM  Result Value Ref Range Status   MRSA by PCR (A) NEGATIVE Final    INVALID, UNABLE TO DETERMINE THE PRESENCE OF TARGET DNA DUE TO SPECIMEN INTEGRITY. RECOLLECTION REQUESTED.    Comment: CALLED DAWN SONGSCER ON 05/27/16 AT 0715 BY [facility]  Culture, blood (routine x 2)     Status: None (Preliminary result)   Collection Time: 05/27/16 11:18 AM  Result Value Ref Range Status   Specimen Description BLOOD RIGHT ARM  Final   Special Requests BOTTLES DRAWN AEROBIC AND ANAEROBIC 9CC  Final   Culture NO GROWTH 2 DAYS  Final   Report Status PENDING   Incomplete  Culture, blood (routine x 2)     Status: None (Preliminary result)   Collection Time: 05/27/16 11:18 AM  Result Value Ref Range Status   Specimen Description BLOOD RIGHT HAND  Final   Special Requests BOTTLES DRAWN AEROBIC AND ANAEROBIC 8CC  Final   Culture NO GROWTH 2 DAYS  Final   Report Status PENDING  Incomplete  MRSA PCR Screening     Status: None   Collection Time: 05/29/16 12:15 PM  Result Value Ref Range Status   MRSA by PCR NEGATIVE NEGATIVE Final    Comment:        The GeneXpert MRSA Assay (FDA approved for NASAL specimens only), is one component of a comprehensive MRSA colonization surveillance program. It is not intended to diagnose MRSA infection nor to guide or monitor treatment for MRSA infections.     Coagulation Studies: No results  for input(s): LABPROT, INR in the last 72 hours.  Urinalysis: No results for input(s): COLORURINE, LABSPEC, PHURINE, GLUCOSEU, HGBUR, BILIRUBINUR, KETONESUR, PROTEINUR, UROBILINOGEN, NITRITE, LEUKOCYTESUR in the last 72 hours.  Invalid input(s): APPERANCEUR    Imaging: No results found.   Medications:    . aspirin EC  81 mg Oral Daily  . calcium acetate (Phos Binder)  667 mg Oral TID WC  .  ceFAZolin (ANCEF) IV  1 g Intravenous Q24H  . diltiazem  120 mg Oral Daily  . docusate sodium  100 mg Oral BID  . heparin  5,000 Units Subcutaneous Q8H  . lidocaine (PF)  30 mL Intradermal Once  . multivitamin  1 tablet Oral Daily  . sodium chloride flush  3 mL Intravenous Q12H   acetaminophen **OR** acetaminophen, baclofen, chlorproMAZINE (THORAZINE) IV, ondansetron **OR** ondansetron (ZOFRAN) IV, oxyCODONE-acetaminophen, sodium chloride  Assessment/ Plan:  Mr. Ronnie Cox is a 50 y.o. black male with hypertension, end stage renal disease on hemodialysis.   TTS [facility] [address]   1. End Stage Renal Disease: with complication of dialysis device.  Positive blood cultures as outpatient. 11/2 growing staph  aureus.  - repeat culture thus far negative.  PermCath to be replaced tomorrow.  Also discuss maturation of a left brachiocephalic fistula with vascular surgery today.  He will need this to be addressed as an outpatient.  PermCath on Wednesday.  2. Hypertension:  Home meds currently on hold.  3. Secondary Hyperparathyroidism with hyperphosphatemia: outpatient PTH 560, phosphorus 6.4 - calcium acetate, repeat phosphorus with next calluses treatment.  4. Anemia of chronic kidney disease: hemoglobin currently 12.5.  Hold Epogen.    LOS: 2 Stacy Sailer 11/6/20173:19 PM

## 2016-05-29 NOTE — Consult Note (Signed)
[facility] Infectious Disease     Reason for Consult: MSSA bacteremia   Referring Physician: Estanislado Spire Date of Admission:  05/27/2016   Active Problems:   Sepsis ([place])   Bacteremia   HPI: Ronnie Cox is a 50 y.o. male admitted with 2 days of fever as well as NV.  He has ESRD and is on HD through a tunneled HD cath as he waits for a L AV fistula to mature.  He has [place] + MSSA. Cath removed by Dr Lucky Cowboy 11/5.    Past Medical History:  Diagnosis Date  . Renal disorder    Past Surgical History:  Procedure Laterality Date  . TUNNELED VENOUS PORT PLACEMENT     Social History  Substance Use Topics  . Smoking status: Never Smoker  . Smokeless tobacco: Never Used  . Alcohol use No   Family History  Problem Relation Age of Onset  . Renal Disease Mother     Allergies:  Allergies  Allergen Reactions  . Iodine Anaphylaxis    Current antibiotics: Antibiotics Given (last 72 hours)    Date/Time Action Medication Dose Rate   05/27/16 0243 Given   vancomycin (VANCOCIN) IVPB 750 mg/150 ml premix 750 mg 150 mL/hr   05/27/16 1405 Given   vancomycin (VANCOCIN) IVPB 1000 mg/200 mL premix 1,000 mg 200 mL/hr   05/28/16 0005 Given   piperacillin-tazobactam (ZOSYN) IVPB 3.375 g 3.375 g 12.5 mL/hr   05/28/16 1750 Given   ceFAZolin (ANCEF) IVPB 1 g/50 mL premix 1 g 100 mL/hr      MEDICATIONS: . aspirin EC  81 mg Oral Daily  . calcium acetate (Phos Binder)  667 mg Oral TID WC  .  ceFAZolin (ANCEF) IV  1 g Intravenous Q24H  . diltiazem  120 mg Oral Daily  . docusate sodium  100 mg Oral BID  . heparin  5,000 Units Subcutaneous Q8H  . lidocaine (PF)  30 mL Intradermal Once  . multivitamin  1 tablet Oral Daily  . sodium chloride flush  3 mL Intravenous Q12H    Review of Systems - 11 systems reviewed and negative per HPI   OBJECTIVE: Temp:  [97.9 F (36.6 C)-98 F (36.7 C)] 98 F (36.7 C) (11/06 0541) Pulse Rate:  [87-98] 96 (11/06 1106) Resp:  [17-18] 18 (11/06 0541) BP:  (107-148)/(61-97) 146/84 (11/06 1106) SpO2:  [98 %-100 %] 98 % (11/06 0541) Physical Exam  Constitutional: He is oriented to person, place, and time. He appears well-developed and well-nourished. No distress.  HENT:  Mouth/Throat: Oropharynx is clear and moist. No oropharyngeal exudate.  Cardiovascular: Normal rate, regular rhythm1/6 sm LLSB Pulmonary/Chest: Effort normal and breath sounds normal. No respiratory distress. He has no wheezes.  Abdominal: Soft. Bowel sounds are normal. He exhibits no distension. There is no tenderness.  Lymphadenopathy:  He has no cervical adenopathy.  Neurological: He is alert and oriented to person, place, and time.  Skin: HD cath site wnl, AVF site wnl Psychiatric: He has a normal mood and affect. His behavior is normal.     LABS: Results for orders placed or performed during the hospital encounter of 05/27/16 (from the past 48 hour(s))  Basic metabolic panel     Status: Abnormal   Collection Time: 05/29/16  6:15 AM  Result Value Ref Range   Sodium 134 (L) 135 - 145 mmol/L   Potassium 4.7 3.5 - 5.1 mmol/L   Chloride 92 (L) 101 - 111 mmol/L   CO2 25 22 - 32  mmol/L   Glucose, Bld 89 65 - 99 mg/dL   BUN 61 (H) 6 - 20 mg/dL   Creatinine, Ser 18.53 (H) 0.61 - 1.24 mg/dL   Calcium 8.6 (L) 8.9 - 10.3 mg/dL   GFR calc non Af Amer 3 (L) >60 mL/min   GFR calc Af Amer 3 (L) >60 mL/min    Comment: (NOTE) The eGFR has been calculated using the CKD EPI equation. This calculation has not been validated in all clinical situations. eGFR's persistently <60 mL/min signify possible Chronic Kidney Disease.    Anion gap 17 (H) 5 - 15  CBC     Status: Abnormal   Collection Time: 05/29/16  6:15 AM  Result Value Ref Range   WBC 6.6 3.8 - 10.6 K/uL   RBC 4.01 (L) 4.40 - 5.90 MIL/uL   Hemoglobin 12.5 (L) 13.0 - 18.0 g/dL   HCT 36.7 (L) 40.0 - 52.0 %   MCV 91.6 80.0 - 100.0 fL   MCH 31.1 26.0 - 34.0 pg   MCHC 33.9 32.0 - 36.0 g/dL   RDW 15.0 (H) 11.5 - 14.5 %    Platelets 146 (L) 150 - 440 K/uL   No components found for: ESR, C REACTIVE PROTEIN MICRO: Recent Results (from the past 720 hour(s))  Culture, blood (Routine x 2)     Status: Abnormal (Preliminary result)   Collection Time: 05/27/16 12:18 AM  Result Value Ref Range Status   Specimen Description BLOOD BLOOD RIGHT FOREARM  Final   Special Requests BOTTLES DRAWN AEROBIC AND ANAEROBIC [place]  Final   Culture  Setup Time   Final    GRAM POSITIVE COCCI ANAEROBIC BOTTLE ONLY CRITICAL RESULT CALLED TO, READ BACK BY AND VERIFIED WITH: SHEEMA HALLAJI AT [place] ON 05/27/16.Marland KitchenMarland KitchenSlidell Memorial Hospital    Culture (A)  Final    STAPHYLOCOCCUS AUREUS SUSCEPTIBILITIES TO FOLLOW Performed at [facility]    Report Status PENDING  Incomplete  Culture, blood (Routine x 2)     Status: Abnormal (Preliminary result)   Collection Time: 05/27/16 12:18 AM  Result Value Ref Range Status   Specimen Description BLOOD RIGHT FOREARM LATERAL  Final   Special Requests   Final    BOTTLES DRAWN AEROBIC AND ANAEROBIC 12CCAERO,9CCANA   Culture  Setup Time   Final    GRAM POSITIVE COCCI ANAEROBIC BOTTLE ONLY CRITICAL VALUE NOTED.  VALUE IS CONSISTENT WITH PREVIOUSLY REPORTED AND CALLED VALUE. CONFIRMED BY [facility]    Culture STAPHYLOCOCCUS AUREUS (A)  Final   Report Status PENDING  Incomplete  Blood Culture ID Panel (Reflexed)     Status: Abnormal   Collection Time: 05/27/16 12:18 AM  Result Value Ref Range Status   Enterococcus species NOT DETECTED NOT DETECTED Final   Listeria monocytogenes NOT DETECTED NOT DETECTED Final   Staphylococcus species DETECTED (A) NOT DETECTED Corrected    Comment: CRITICAL RESULT CALLED TO, READ BACK BY AND VERIFIED WITH: SHEEMA HALLAJI AT 1913 ON 05/27/16.Marland KitchenMarland KitchenLedbetter CORRECTED ON 11/05 AT 2778: PREVIOUSLY REPORTED AS DETECTED RBV SHEEMA HALLAJI AT 1913 ON 05/27/16.Marland KitchenMarland KitchenSunfield    Staphylococcus aureus DETECTED (A) NOT DETECTED Corrected    Comment: CRITICAL RESULT CALLED TO, READ BACK BY AND VERIFIED  WITH: SHEEMA HALLAJI AT 1913 ON 05/27/16.Marland KitchenMarland KitchenBoone CORRECTED ON 11/05 AT 2423: PREVIOUSLY REPORTED AS DETECTED RBV SHEEMA HALLAJI AT 1913 ON 05/27/16.Marland KitchenMarland KitchenHuetter    Streptococcus species NOT DETECTED NOT DETECTED Final   Streptococcus agalactiae NOT DETECTED NOT DETECTED Final   Streptococcus pneumoniae NOT DETECTED NOT DETECTED Final   Streptococcus  pyogenes NOT DETECTED NOT DETECTED Final   Acinetobacter baumannii NOT DETECTED NOT DETECTED Final   Enterobacteriaceae species NOT DETECTED NOT DETECTED Final   Enterobacter cloacae complex NOT DETECTED NOT DETECTED Final   Escherichia coli NOT DETECTED NOT DETECTED Final   Klebsiella oxytoca NOT DETECTED NOT DETECTED Final   Klebsiella pneumoniae NOT DETECTED NOT DETECTED Final   Proteus species NOT DETECTED NOT DETECTED Final   Serratia marcescens NOT DETECTED NOT DETECTED Final   Haemophilus influenzae NOT DETECTED NOT DETECTED Final   Neisseria meningitidis NOT DETECTED NOT DETECTED Final   Pseudomonas aeruginosa NOT DETECTED NOT DETECTED Final   Candida albicans NOT DETECTED NOT DETECTED Final   Candida glabrata NOT DETECTED NOT DETECTED Final   Candida krusei NOT DETECTED NOT DETECTED Final   Candida parapsilosis NOT DETECTED NOT DETECTED Final   Candida tropicalis NOT DETECTED NOT DETECTED Final  MRSA PCR Screening     Status: Abnormal   Collection Time: 05/27/16  4:02 AM  Result Value Ref Range Status   MRSA by PCR (A) NEGATIVE Final    INVALID, UNABLE TO DETERMINE THE PRESENCE OF TARGET DNA DUE TO SPECIMEN INTEGRITY. RECOLLECTION REQUESTED.    Comment: CALLED DAWN SONGSCER ON 05/27/16 AT 0715 BY [facility]  Culture, blood (routine x 2)     Status: None (Preliminary result)   Collection Time: 05/27/16 11:18 AM  Result Value Ref Range Status   Specimen Description BLOOD RIGHT ARM  Final   Special Requests BOTTLES DRAWN AEROBIC AND ANAEROBIC [place]  Final   Culture NO GROWTH 2 DAYS  Final   Report Status PENDING  Incomplete  Culture, blood (routine  x 2)     Status: None (Preliminary result)   Collection Time: 05/27/16 11:18 AM  Result Value Ref Range Status   Specimen Description BLOOD RIGHT HAND  Final   Special Requests BOTTLES DRAWN AEROBIC AND ANAEROBIC 8CC  Final   Culture NO GROWTH 2 DAYS  Final   Report Status PENDING  Incomplete    IMAGING: Dg Chest 2 View  Result Date: 05/27/2016 CLINICAL DATA:  Acute onset of nausea and vomiting. Fever. Burning sensation at the patient's tunneled catheter. Initial encounter. EXAM: CHEST  2 VIEW COMPARISON:  None. FINDINGS: The lungs are well-aerated. Vascular congestion is noted. A right-sided dual-lumen catheter is noted ending about the distal SVC. There is no evidence of focal opacification, pleural effusion or pneumothorax. The heart is normal in size; the mediastinal contour is within normal limits. No acute osseous abnormalities are seen. IMPRESSION: Vascular congestion noted. Lungs remain grossly clear. Right-sided dual-lumen catheter noted ending about the distal SVC. Electronically Signed   By: Garald Balding M.D.   On: 05/27/2016 00:53    Assessment:   THEOTIS GERDEMAN is a 50 y.o. male with ESRD admitted with Permacath infection, with MSSA bacteremia. Cath removed 11/5, afebrile.  Recommendations Repeat bcx Check Echo Continue ancef- will need 2 week minimum to be given at HD.   Thank you very much for allowing me to participate in the care of this patient. Please call with questions.   Cheral Marker. Ola Spurr, MD

## 2016-05-29 NOTE — Progress Notes (Signed)
[employer] -  at [facility]   PATIENT NAME: Ronnie HatchShawn Cox    MR#:  [id number]  DATE OF BIRTH:  May 13, 1966  SUBJECTIVE:  CHIEF COMPLAINT:   Chief Complaint  Patient presents with  . Fever   No complaint. S/p removal of Dialysis catheter. REVIEW OF SYSTEMS:  Review of Systems  Constitutional: Negative for chills, fever and malaise/fatigue.  Eyes: Negative for blurred vision and double vision.  Respiratory: Negative for cough, shortness of breath, wheezing and stridor.   Cardiovascular: Negative for chest pain and leg swelling.  Gastrointestinal: Negative for abdominal pain, diarrhea, nausea and vomiting.  Genitourinary: Negative for dysuria.  Musculoskeletal: Negative for joint pain.  Skin: Negative for rash.  Neurological: Negative for dizziness, focal weakness, loss of consciousness, weakness and headaches.  Psychiatric/Behavioral: Negative for depression. The patient is not nervous/anxious.     DRUG ALLERGIES:   Allergies  Allergen Reactions  . Iodine Anaphylaxis   VITALS:  Blood pressure (!) 146/84, pulse 96, temperature 98 F (36.7 C), temperature source Oral, resp. rate 18, height 6\' 3"  (1.905 m), weight 190 lb 12.8 oz (86.5 kg), SpO2 98 %. PHYSICAL EXAMINATION:  Physical Exam  Constitutional: He is oriented to person, place, and time and well-developed, well-nourished, and in no distress.  HENT:  Mouth/Throat: Oropharynx is clear and moist.  Eyes: Conjunctivae and EOM are normal.  Neck: Normal range of motion. Neck supple. No JVD present. No tracheal deviation present.  Cardiovascular: Normal rate, regular rhythm and normal heart sounds.  Exam reveals no gallop.   No murmur heard. Pulmonary/Chest: Effort normal and breath sounds normal. No respiratory distress. He has no wheezes. He has no rales.  Abdominal: Soft. Bowel sounds are normal. He exhibits no distension. There is no tenderness.  Musculoskeletal: Normal range of motion. He  exhibits no edema.  Neurological: He is alert and oriented to person, place, and time. No cranial nerve deficit.  Skin: No rash noted. No erythema.  Psychiatric: Affect and judgment normal.   LABORATORY PANEL:   CBC  Recent Labs Lab 05/29/16 0615  WBC 6.6  HGB 12.5*  HCT 36.7*  PLT 146*   ------------------------------------------------------------------------------------------------------------------ Chemistries   Recent Labs Lab 05/27/16 0018 05/29/16 0615  NA 130* 134*  K 4.8 4.7  CL 85* 92*  CO2 26 25  GLUCOSE 121* 89  BUN 66* 61*  CREATININE 17.76* 18.53*  CALCIUM 9.0 8.6*  AST 38  --   ALT 44  --   ALKPHOS 45  --   BILITOT 0.7  --    RADIOLOGY:  No results found. ASSESSMENT AND PLAN:   This is a 50 year old male admitted for sepsis. 1. Sepsis with staph aureus bacteremia. Likely source of infection is tunneled dialysis catheter. His outpatient blood cultures showed staph aureus.  disontinue vancomycin and Zosyn, started cefazolin. Follow up inpatient blood culture, so far negative. Removed dialysis catheter.  ID consult.  2. End-stage renal disease: Removed dialysis catheter. May need new dialysis catheter with if new blood culture is negative per Dr. Cherylann RatelLateef.  3. Hiccups: Started following vancomycin which the patient states is a common side effect for him. He received baclofen in the emergency department without relief. On a small dose of Thorazine. Improved.  Hyponatremia. Improved.  I discussed with Dr. Cherylann RatelLateef  All the records are reviewed and case discussed with Care Management/Social Worker. Management plans discussed with the patient, family and they are in agreement.  CODE STATUS: Full code  TOTAL TIME TAKING CARE  OF THIS PATIENT: 25 minutes.   More than 50% of the time was spent in counseling/coordination of care: YES  POSSIBLE D/C IN 2 DAYS, DEPENDING ON CLINICAL CONDITION.   Shaune Pollackhen, Milah Recht M.D on 05/29/2016 at 1:33 PM  Between 7am to 6pm  - Pager - [phone]  After 6pm go to [email or web address] - Social research officer, governmentpassword EPAS [facility]  [facility] [place] [facility]  Office  [phone]  CC: Primary care physician; Pcp Not In System  Note: This dictation was prepared with Dragon dictation along with smaller phrase technology. Any transcriptional errors that result from this process are unintentional.

## 2016-05-29 NOTE — Progress Notes (Signed)
  Echocardiogram 2D Echocardiogram has been performed.  Tye SavoyCasey N Ivan Maskell 05/29/2016, 3:24 PM

## 2016-05-29 NOTE — Care Management Important Message (Signed)
Important Message  Patient Details  Name: Ronnie Cox MRN: [id number] Date of Birth: 11/26/1965   Medicare Important Message Given:  Yes    Chapman FitchBOWEN, Mikiya Nebergall T, RN 05/29/2016, 11:21 AM

## 2016-05-30 LAB — CULTURE, BLOOD (ROUTINE X 2)

## 2016-05-30 NOTE — Progress Notes (Signed)
[facility] INFECTIOUS DISEASE PROGRESS NOTE Date of Admission:  05/27/2016     ID: Ronnie Cox is a 50 y.o. male with MSSA bacteremia Active Problems:   Sepsis (H[facility])   Bacteremia   Subjective: NO fevers, feels well, up and walking halls  ROS  Eleven systems are reviewed and negative except per hpi  Medications:  Antibiotics Given (last 72 hours)    Date/Time Action Medication Dose Rate   05/28/16 0005 Given   piperacillin-tazobactam (ZOSYN) IVPB 3.375 g 3.375 g 12.5 mL/hr   05/28/16 1750 Given   ceFAZolin (ANCEF) IVPB 1 g/50 mL premix 1 g 100 mL/hr   05/29/16 1753 Given   ceFAZolin (ANCEF) IVPB 1 g/50 mL premix 1 g 100 mL/hr     . aspirin EC  81 mg Oral Daily  . calcium acetate (Phos Binder)  667 mg Oral TID WC  .  ceFAZolin (ANCEF) IV  1 g Intravenous Q24H  . diltiazem  120 mg Oral Daily  . docusate sodium  100 mg Oral BID  . heparin  5,000 Units Subcutaneous Q8H  . lidocaine (PF)  30 mL Intradermal Once  . multivitamin  1 tablet Oral Daily  . sodium chloride flush  3 mL Intravenous Q12H    Objective: Vital signs in last 24 hours: Temp:  [97.9 F (36.6 C)-98 F (36.7 C)] 98 F (36.7 C) (11/07 1100) Pulse Rate:  [80-89] 81 (11/07 1100) Resp:  [17-18] 18 (11/07 1100) BP: (120-151)/(68-78) 120/72 (11/07 1100) SpO2:  [97 %-99 %] 99 % (11/07 1100) Weight:  [88 kg (194 lb 1.6 oz)] 88 kg (194 lb 1.6 oz) (11/07 0500) Constitutional: He is oriented to person, place, and time. He appears well-developed and well-nourished. No distress.  HENT:  Mouth/Throat: Oropharynx is clear and moist. No oropharyngeal exudate.  Cardiovascular: Normal rate, regular rhythm1/6 sm LLSB Pulmonary/Chest: Effort normal and breath sounds normal. No respiratory distress. He has no wheezes.  Abdominal: Soft. Bowel sounds are normal. He exhibits no distension. There is no tenderness.  Lymphadenopathy:  He has no cervical adenopathy.  Neurological: He is alert and oriented to person,  place, and time.  Skin: HD cath site wnl, AVF site wnl  Lab Results  Recent Labs  05/29/16 0615  WBC 6.6  HGB 12.5*  HCT 36.7*  NA 134*  K 4.7  CL 92*  CO2 25  BUN 61*  CREATININE 18.53*    Microbiology: Results for orders placed or performed during the hospital encounter of 05/27/16  Culture, blood (Routine x 2)     Status: Abnormal (Preliminary result)   Collection Time: 05/27/16 12:18 AM  Result Value Ref Range Status   Specimen Description BLOOD BLOOD RIGHT FOREARM  Final   Special Requests BOTTLES DRAWN AEROBIC AND ANAEROBIC 7CCAERO,7CCANA  Final   Culture  Setup Time   Final    GRAM POSITIVE COCCI ANAEROBIC BOTTLE ONLY CRITICAL RESULT CALLED TO, READ BACK BY AND VERIFIED WITH: SHEEMA HALLAJI AT 1913 ON 05/27/16.Marland KitchenMarland KitchenSutter Roseville Endoscopy Center    Culture (A)  Final    STAPHYLOCOCCUS AUREUS SUSCEPTIBILITIES TO FOLLOW Performed at [facility]    Report Status PENDING  Incomplete  Culture, blood (Routine x 2)     Status: Abnormal   Collection Time: 05/27/16 12:18 AM  Result Value Ref Range Status   Specimen Description BLOOD RIGHT FOREARM LATERAL  Final   Special Requests   Final    BOTTLES DRAWN AEROBIC AND ANAEROBIC 12CCAERO,9CCANA   Culture  Setup Time   Final  GRAM POSITIVE COCCI ANAEROBIC BOTTLE ONLY CRITICAL VALUE NOTED.  VALUE IS CONSISTENT WITH PREVIOUSLY REPORTED AND CALLED VALUE. CONFIRMED BY [facility]    Culture (A)  Final    STAPHYLOCOCCUS AUREUS SUSCEPTIBILITIES PERFORMED ON PREVIOUS CULTURE WITHIN THE LAST 5 DAYS. [facility]    Report Status 05/30/2016 FINAL  Final  Blood Culture ID Panel (Reflexed)     Status: Abnormal   Collection Time: 05/27/16 12:18 AM  Result Value Ref Range Status   Enterococcus species NOT DETECTED NOT DETECTED Final   Listeria monocytogenes NOT DETECTED NOT DETECTED Final   Staphylococcus species DETECTED (A) NOT DETECTED Corrected    Comment: CRITICAL RESULT CALLED TO, READ BACK BY AND VERIFIED WITH: SHEEMA  HALLAJI AT 1913 ON 05/27/16.Marland Kitchen.Marland Kitchen.[facility] CORRECTED ON 11/05 AT 0659: PREVIOUSLY REPORTED AS DETECTED RBV SHEEMA HALLAJI AT 1913 ON 05/27/16.Marland Kitchen.Marland Kitchen.[facility]    Staphylococcus aureus DETECTED (A) NOT DETECTED Corrected    Comment: CRITICAL RESULT CALLED TO, READ BACK BY AND VERIFIED WITH: SHEEMA HALLAJI AT 1913 ON 05/27/16.Marland Kitchen.Marland Kitchen.[facility] CORRECTED ON 11/05 AT 0659: PREVIOUSLY REPORTED AS DETECTED RBV SHEEMA HALLAJI AT 1913 ON 05/27/16.Marland Kitchen.Marland Kitchen.[facility]    Streptococcus species NOT DETECTED NOT DETECTED Final   Streptococcus agalactiae NOT DETECTED NOT DETECTED Final   Streptococcus pneumoniae NOT DETECTED NOT DETECTED Final   Streptococcus pyogenes NOT DETECTED NOT DETECTED Final   Acinetobacter baumannii NOT DETECTED NOT DETECTED Final   Enterobacteriaceae species NOT DETECTED NOT DETECTED Final   Enterobacter cloacae complex NOT DETECTED NOT DETECTED Final   Escherichia coli NOT DETECTED NOT DETECTED Final   Klebsiella oxytoca NOT DETECTED NOT DETECTED Final   Klebsiella pneumoniae NOT DETECTED NOT DETECTED Final   Proteus species NOT DETECTED NOT DETECTED Final   Serratia marcescens NOT DETECTED NOT DETECTED Final   Haemophilus influenzae NOT DETECTED NOT DETECTED Final   Neisseria meningitidis NOT DETECTED NOT DETECTED Final   Pseudomonas aeruginosa NOT DETECTED NOT DETECTED Final   Candida albicans NOT DETECTED NOT DETECTED Final   Candida glabrata NOT DETECTED NOT DETECTED Final   Candida krusei NOT DETECTED NOT DETECTED Final   Candida parapsilosis NOT DETECTED NOT DETECTED Final   Candida tropicalis NOT DETECTED NOT DETECTED Final  MRSA PCR Screening     Status: Abnormal   Collection Time: 05/27/16  4:02 AM  Result Value Ref Range Status   MRSA by PCR (A) NEGATIVE Final    INVALID, UNABLE TO DETERMINE THE PRESENCE OF TARGET DNA DUE TO SPECIMEN INTEGRITY. RECOLLECTION REQUESTED.    Comment: CALLED DAWN SONGSCER ON 05/27/16 AT 07[facility]  Culture, blood (routine x 2)     Status: None (Preliminary result)   Collection  Time: 05/27/16 11:18 AM  Result Value Ref Range Status   Specimen Description BLOOD RIGHT ARM  Final   Special Requests BOTTLES DRAWN AEROBIC AND ANAEROBIC 9CC  Final   Culture NO GROWTH 3 DAYS  Final   Report Status PENDING  Incomplete  Culture, blood (routine x 2)     Status: None (Preliminary result)   Collection Time: 05/27/16 11:18 AM  Result Value Ref Range Status   Specimen Description BLOOD RIGHT HAND  Final   Special Requests BOTTLES DRAWN AEROBIC AND ANAEROBIC 8CC  Final   Culture NO GROWTH 3 DAYS  Final   Report Status PENDING  Incomplete  MRSA PCR Screening     Status: None   Collection Time: 05/29/16 12:15 PM  Result Value Ref Range Status   MRSA by PCR NEGATIVE NEGATIVE Final  Comment:        The GeneXpert MRSA Assay (FDA approved for NASAL specimens only), is one component of a comprehensive MRSA colonization surveillance program. It is not intended to diagnose MRSA infection nor to guide or monitor treatment for MRSA infections.   Culture, blood (single) w Reflex to ID Panel     Status: None (Preliminary result)   Collection Time: 05/29/16  3:15 PM  Result Value Ref Range Status   Specimen Description BLOOD RT HAND  Final   Special Requests BOTTLES DRAWN AEROBIC AND ANAEROBIC 6CC  Final   Culture NO GROWTH < 24 HOURS  Final   Report Status PENDING  Incomplete    Studies/Results: Study Conclusions  - Left ventricle: The cavity size was normal. Systolic function was   normal. The estimated ejection fraction was 55%. Wall motion was   normal; there were no regional wall motion abnormalities. Doppler   parameters are consistent with abnormal left ventricular   relaxation (grade 1 diastolic dysfunction). - Aortic valve: Valve area (Vmax): 2.96 cm^2. - Left atrium: The atrium was mildly dilated. - Atrial septum: No defect or patent foramen ovale was identified.  Impressions:  - There was no evidence of a vegetation.   Assessment/Plan: Burnard BuntingShawn M  Shepheard is a 50 y.o. male with ESRD admitted with Permacath infection, with MSSA bacteremia. Cath removed 11/5, afebrile. Bcx 11/6 ngtd. TTE negative   Recommendations Once bcx neg x 48 hours ok to replace Cath Continue ancef- will need 2 week minimum to be given at HD.  Thank you very much for the consult. Will follow with you.  Ashely Goosby P   05/30/2016, 2:28 PM

## 2016-05-30 NOTE — Progress Notes (Signed)
[facility]  ROUNDING NOTE   Subjective:  Patient feeling well today. PermCath to be placed tomorrow. Patient denies shortness of breath.   Objective:  Vital signs in last 24 hours:  Temp:  [97.9 F (36.6 C)-98 F (36.7 C)] 98 F (36.7 C) (11/07 1100) Pulse Rate:  [80-89] 81 (11/07 1100) Resp:  [17-18] 18 (11/07 1100) BP: (120-151)/(68-78) 120/72 (11/07 1100) SpO2:  [97 %-99 %] 99 % (11/07 1100) Weight:  [88 kg (194 lb 1.6 oz)] 88 kg (194 lb 1.6 oz) (11/07 0500)  Weight change:  Filed Weights   05/27/16 1450 05/28/16 0500 05/30/16 0500  Weight: 86.7 kg (191 lb 2.2 oz) 86.5 kg (190 lb 12.8 oz) 88 kg (194 lb 1.6 oz)    Intake/Output: I/O last 3 completed shifts: In: 235 [P.O.:160; IV Piggyback:75] Out: 0    Intake/Output this shift:  Total I/O In: 320 [P.O.:320] Out: 0   Physical Exam: General: NAD  Head: Normocephalic, atraumatic. Moist oral mucosal membranes  Eyes: Anicteric  Neck: Supple, trachea midline  Lungs:  Clear to auscultation  Heart: S1S2 no rubs  Abdomen:  Soft, nontender, bowel sounds present  Extremities: no peripheral edema.  Neurologic: Nonfocal, moving all four extremities  Skin: No lesions  Access: Maturing left AVF +bruit and thrill    Basic Metabolic Panel:  Recent Labs Lab 05/27/16 0018 05/29/16 0615  NA 130* 134*  K 4.8 4.7  CL 85* 92*  CO2 26 25  GLUCOSE 121* 89  BUN 66* 61*  CREATININE 17.76* 18.53*  CALCIUM 9.0 8.6*    Liver Function Tests:  Recent Labs Lab 05/27/16 0018  AST 38  ALT 44  ALKPHOS 45  BILITOT 0.7  PROT 7.7  ALBUMIN 3.8   No results for input(s): LIPASE, AMYLASE in the last 168 hours. No results for input(s): AMMONIA in the last 168 hours.  CBC:  Recent Labs Lab 05/27/16 0018 05/29/16 0615  WBC 11.6* 6.6  NEUTROABS 9.9*  --   HGB 14.4 12.5*  HCT 42.9 36.7*  MCV 91.0 91.6  PLT 131* 146*    Cardiac Enzymes: No results for input(s): CKTOTAL, CKMB, CKMBINDEX, TROPONINI in  the last 168 hours.  BNP: Invalid input(s): POCBNP  CBG: No results for input(s): GLUCAP in the last 168 hours.  Microbiology: Results for orders placed or performed during the hospital encounter of 05/27/16  Culture, blood (Routine x 2)     Status: Abnormal (Preliminary result)   Collection Time: 05/27/16 12:18 AM  Result Value Ref Range Status   Specimen Description BLOOD BLOOD RIGHT FOREARM  Final   Special Requests BOTTLES DRAWN AEROBIC AND ANAEROBIC 7CCAERO,7CCANA  Final   Culture  Setup Time   Final    GRAM POSITIVE COCCI ANAEROBIC BOTTLE ONLY CRITICAL RESULT CALLED TO, READ BACK BY AND VERIFIED WITH: SHEEMA HALLAJI AT 1913 ON 05/27/16.Marland Kitchen.[facility]    Culture (A)  Final    STAPHYLOCOCCUS AUREUS SUSCEPTIBILITIES TO FOLLOW [facility]    Report Status PENDING  Incomplete  Culture, blood (Routine x 2)     Status: Abnormal   Collection Time: 05/27/16 12:18 AM  Result Value Ref Range Status   Specimen Description BLOOD RIGHT FOREARM LATERAL  Final   Special Requests   Final    BOTTLES DRAWN AEROBIC AND ANAEROBIC 12CCAERO,9CCANA   Culture  Setup Time   Final    GRAM POSITIVE COCCI ANAEROBIC BOTTLE ONLY CRITICAL VALUE NOTED.  VALUE IS CONSISTENT WITH PREVIOUSLY REPORTED AND CALLED VALUE.  CONFIRMED BY [facility]    Culture (A)  Final    STAPHYLOCOCCUS AUREUS SUSCEPTIBILITIES PERFORMED ON PREVIOUS CULTURE WITHIN THE LAST 5 DAYS. Performed at [facility]    Report Status 05/30/2016 FINAL  Final  Blood Culture ID Panel (Reflexed)     Status: Abnormal   Collection Time: 05/27/16 12:18 AM  Result Value Ref Range Status   Enterococcus species NOT DETECTED NOT DETECTED Final   Listeria monocytogenes NOT DETECTED NOT DETECTED Final   Staphylococcus species DETECTED (A) NOT DETECTED Corrected    Comment: CRITICAL RESULT CALLED TO, READ BACK BY AND VERIFIED WITH: SHEEMA HALLAJI AT 1913 ON 05/27/16.[facility]MC CORRECTED ON 11/05 AT 0659: PREVIOUSLY REPORTED AS DETECTED  RBV SHEEMA HALLAJI AT 1913 ON 05/27/16.[facility]    Staphylococcus aureus DETECTED (A) NOT DETECTED Corrected    Comment: CRITICAL RESULT CALLED TO, READ BACK BY AND VERIFIED WITH: SHEEMA HALLAJI AT 1913 ON 05/27/16.[facility] KitchenMar[facility] KitchenMMC CORRECTED ON 11/05 AT 0659: PREVIOUSLY REPORTED AS DETECTED RBV SHEEMA HALLAJI AT 1913 ON 05/27/16.[facility] Kitchen[facility]    Streptococcus species NOT DETECTED NOT DETECTED Final   Streptococcus agalactiae NOT DETECTED NOT DETECTED Final   Streptococcus pneumoniae NOT DETECTED NOT DETECTED Final   Streptococcus pyogenes NOT DETECTED NOT DETECTED Final   Acinetobacter baumannii NOT DETECTED NOT DETECTED Final   Enterobacteriaceae species NOT DETECTED NOT DETECTED Final   Enterobacter cloacae complex NOT DETECTED NOT DETECTED Final   Escherichia coli NOT DETECTED NOT DETECTED Final   Klebsiella oxytoca NOT DETECTED NOT DETECTED Final   Klebsiella pneumoniae NOT DETECTED NOT DETECTED Final   Proteus species NOT DETECTED NOT DETECTED Final   Serratia marcescens NOT DETECTED NOT DETECTED Final   Haemophilus influenzae NOT DETECTED NOT DETECTED Final   Neisseria meningitidis NOT DETECTED NOT DETECTED Final   Pseudomonas aeruginosa NOT DETECTED NOT DETECTED Final   Candida albicans NOT DETECTED NOT DETECTED Final   Candida glabrata NOT DETECTED NOT DETECTED Final   Candida krusei NOT DETECTED NOT DETECTED Final   Candida parapsilosis NOT DETECTED NOT DETECTED Final   Candida tropicalis NOT DETECTED NOT DETECTED Final  MRSA PCR Screening     Status: Abnormal   Collection Time: 05/27/16  4:02 AM  Result Value Ref Range Status   MRSA by PCR (A) NEGATIVE Final    INVALID, UNABLE TO DETERMINE THE PRESENCE OF TARGET DNA DUE TO SPECIMEN INTEGRITY. RECOLLECTION REQUESTED.    Comment: CALLED DAWN SONGSCER ON 05/27/16 AT 0715 BY [facility]  Culture, blood (routine x 2)     Status: None (Preliminary result)   Collection Time: 05/27/16 11:18 AM  Result Value Ref Range Status   Specimen Description BLOOD RIGHT ARM   Final   Special Requests BOTTLES DRAWN AEROBIC AND ANAEROBIC 9CC  Final   Culture NO GROWTH 3 DAYS  Final   Report Status PENDING  Incomplete  Culture, blood (routine x 2)     Status: None (Preliminary result)   Collection Time: 05/27/16 11:18 AM  Result Value Ref Range Status   Specimen Description BLOOD RIGHT HAND  Final   Special Requests BOTTLES DRAWN AEROBIC AND ANAEROBIC 8CC  Final   Culture NO GROWTH 3 DAYS  Final   Report Status PENDING  Incomplete  MRSA PCR Screening     Status: None   Collection Time: 05/29/16 12:15 PM  Result Value Ref Range Status   MRSA by PCR NEGATIVE NEGATIVE Final    Comment:        The GeneXpert MRSA Assay (FDA approved for NASAL specimens only),  is one component of a comprehensive MRSA colonization surveillance program. It is not intended to diagnose MRSA infection nor to guide or monitor treatment for MRSA infections.   Culture, blood (single) w Reflex to ID Panel     Status: None (Preliminary result)   Collection Time: 05/29/16  3:15 PM  Result Value Ref Range Status   Specimen Description BLOOD RT HAND  Final   Special Requests BOTTLES DRAWN AEROBIC AND ANAEROBIC 6CC  Final   Culture NO GROWTH < 24 HOURS  Final   Report Status PENDING  Incomplete    Coagulation Studies: No results for input(s): LABPROT, INR in the last 72 hours.  Urinalysis: No results for input(s): COLORURINE, LABSPEC, PHURINE, GLUCOSEU, HGBUR, BILIRUBINUR, KETONESUR, PROTEINUR, UROBILINOGEN, NITRITE, LEUKOCYTESUR in the last 72 hours.  Invalid input(s): APPERANCEUR    Imaging: No results found.   Medications:    . aspirin EC  81 mg Oral Daily  . calcium acetate (Phos Binder)  667 mg Oral TID WC  .  ceFAZolin (ANCEF) IV  1 g Intravenous Q24H  . diltiazem  120 mg Oral Daily  . docusate sodium  100 mg Oral BID  . heparin  5,000 Units Subcutaneous Q8H  . lidocaine (PF)  30 mL Intradermal Once  . multivitamin  1 tablet Oral Daily  . sodium chloride flush   3 mL Intravenous Q12H   acetaminophen **OR** acetaminophen, baclofen, chlorproMAZINE (THORAZINE) IV, ondansetron **OR** ondansetron (ZOFRAN) IV, oxyCODONE-acetaminophen, sodium chloride  Assessment/ Plan:  Mr. Ronnie Cox is a 50 y.o. black male with hypertension, end stage renal disease on hemodialysis.   TTS [facility] [address] [address]   1. End Stage Renal Disease: with complication of dialysis device.  Positive blood cultures as outpatient. 11/2 growing staph aureus.  - repeat blood cultures from 05/27/16 remain negative.  2-D echocardiogram was negative for vegetation.  Continue cefazolin.  We will plan for hemodialysis tomorrow after PermCath is replaced.  2. Hypertension:  Home meds currently on hold. Blood pressure currently 120/72.  3. Secondary Hyperparathyroidism with hyperphosphatemia: outpatient PTH 560, phosphorus 6.4 -continue calcium acetate.  Repeat phosphorus tomorrow.  4. Anemia of chronic kidney disease: hemoglobin currently 12.5.  Hold Epogen.    LOS: 3 Magaline Steinberg 11/7/20172:42 PM

## 2016-05-30 NOTE — Progress Notes (Signed)
[employer] [facility] [facility]   PATIENT NAME: Ronnie Cox    MR#:  [id number]  DATE OF BIRTH:  15-May-1966  SUBJECTIVE:  CHIEF COMPLAINT:   Chief Complaint  Patient presents with  . Fever   No complaint. S/p removal of Dialysis catheter. REVIEW OF SYSTEMS:  Review of Systems  Constitutional: Negative for chills, fever and malaise/fatigue.  Eyes: Negative for blurred vision and double vision.  Respiratory: Negative for cough, shortness of breath, wheezing and stridor.   Cardiovascular: Negative for chest pain and leg swelling.  Gastrointestinal: Negative for abdominal pain, diarrhea, nausea and vomiting.  Genitourinary: Negative for dysuria.  Musculoskeletal: Negative for joint pain.  Skin: Negative for rash.  Neurological: Negative for dizziness, focal weakness, loss of consciousness, weakness and headaches.  Psychiatric/Behavioral: Negative for depression. The patient is not nervous/anxious.     DRUG ALLERGIES:   Allergies  Allergen Reactions  . Iodine Anaphylaxis   VITALS:  Blood pressure 120/72, pulse 81, temperature 98 F (36.7 C), temperature source Oral, resp. rate 18, height 6\' 3"  (1.905 m), weight 194 lb 1.6 oz (88 kg), SpO2 99 %. PHYSICAL EXAMINATION:  Physical Exam  Constitutional: He is oriented to person, place, and time and well-developed, well-nourished, and in no distress.  HENT:  Mouth/Throat: Oropharynx is clear and moist.  Eyes: Conjunctivae and EOM are normal.  Neck: Normal range of motion. Neck supple. No JVD present. No tracheal deviation present.  Cardiovascular: Normal rate, regular rhythm and normal heart sounds.  Exam reveals no gallop.   No murmur heard. Pulmonary/Chest: Effort normal and breath sounds normal. No respiratory distress. He has no wheezes. He has no rales.  Abdominal: Soft. Bowel sounds are normal. He exhibits no distension. There is no tenderness.  Musculoskeletal: Normal range of motion. He exhibits  no edema.  Neurological: He is alert and oriented to person, place, and time. No cranial nerve deficit.  Skin: No rash noted. No erythema.  Psychiatric: Affect and judgment normal.   LABORATORY PANEL:   CBC  Recent Labs Lab 05/29/16 0615  WBC 6.6  HGB 12.5*  HCT 36.7*  PLT 146*   ------------------------------------------------------------------------------------------------------------------ Chemistries   Recent Labs Lab 05/27/16 0018 05/29/16 0615  NA 130* 134*  K 4.8 4.7  CL 85* 92*  CO2 26 25  GLUCOSE 121* 89  BUN 66* 61*  CREATININE 17.76* 18.53*  CALCIUM 9.0 8.6*  AST 38  --   ALT 44  --   ALKPHOS 45  --   BILITOT 0.7  --    RADIOLOGY:  No results found. ASSESSMENT AND PLAN:   This is a 50 year old male admitted for sepsis. 1. Sepsis with staph aureus bacteremia. Likely source of infection is tunneled dialysis catheter. His outpatient blood cultures showed staph aureus.  disontinue vancomycin and Zosyn, started cefazolin. Follow up inpatient blood culture, so far negative. Removed dialysis catheter.  Continue ancef- will need 2 week minimum to be given at HD  per Dr. Sampson GoonFitzgerald.   2. End-stage renal disease: Removed dialysis catheter. Perma catheter placement tomorrow iif new blood culture is negative for 48 hours per Dr. Sampson GoonFitzgerald and Dr. Cherylann RatelLateef.  3. Hiccups: Started following vancomycin which the patient states is a common side effect for him. He received baclofen in the emergency department without relief. On a small dose of Thorazine. Improved.  Hyponatremia. Improved.  I discussed with Dr. Cherylann RatelLateef  All the records are reviewed and case discussed with Care Management/Social Worker. Management plans discussed with  the patient, family and they are in agreement.  CODE STATUS: Full code  TOTAL TIME TAKING CARE OF THIS PATIENT: 25 minutes.   More than 50% of the time was spent in counseling/coordination of care: YES  POSSIBLE D/C home Tomorrow,  DEPENDING ON CLINICAL CONDITION.   Shaune Pollackhen, Geneva Barrero M.D on 05/30/2016 at 2:46 PM  Between 7am to 6pm - Pager - [phone]  After 6pm go to [email or web address] - Social research officer, governmentpassword [email or web address] [email or web address]  [facility]  Office  [phone]  CC: Primary care physician; Pcp Not In System  Note: This dictation was prepared with Dragon dictation along with smaller phrase technology. Any transcriptional errors that result from this process are unintentional.

## 2016-05-31 ENCOUNTER — Encounter
Admission: EM | Disposition: A | Discharge status: Home / Self Care | Payer: Self-pay | Source: Home / Self Care | Attending: Internal Medicine

## 2016-05-31 DIAGNOSIS — N186 End stage renal disease: Secondary | ICD-10-CM

## 2016-05-31 HISTORY — PX: PERIPHERAL VASCULAR CATHETERIZATION: SHX172C

## 2016-05-31 LAB — PHOSPHORUS: PHOSPHORUS: 8.6 mg/dL — AB (ref 2.5–4.6)

## 2016-05-31 SURGERY — DIALYSIS/PERMA CATHETER INSERTION
Anesthesia: Moderate Sedation

## 2016-05-31 MED ORDER — HEPARIN SODIUM (PORCINE) 1000 UNIT/ML IJ SOLN
INTRAMUSCULAR | Status: AC
  Filled 2016-05-31: qty 1

## 2016-05-31 MED ORDER — CEFAZOLIN IN D5W 1 GM/50ML IV SOLN: INTRAVENOUS | 0 refills | Status: AC

## 2016-05-31 MED ORDER — LIDOCAINE-EPINEPHRINE (PF) 1 %-1:200000 IJ SOLN
INTRAMUSCULAR | Status: AC
  Filled 2016-05-31: qty 30

## 2016-05-31 MED ORDER — FAMOTIDINE 20 MG PO TABS
40.0000 mg | ORAL_TABLET | Freq: Once | ORAL | Status: AC
  Administered 2016-05-31: 40 mg via ORAL

## 2016-05-31 MED ORDER — FENTANYL CITRATE (PF) 100 MCG/2ML IJ SOLN
INTRAMUSCULAR | Status: AC
  Filled 2016-05-31: qty 2

## 2016-05-31 MED ORDER — DEXTROSE 5 % IV SOLN
1.5000 g | Freq: Once | INTRAVENOUS | Status: AC
  Administered 2016-05-31: 1.5 g via INTRAVENOUS

## 2016-05-31 MED ORDER — MIDAZOLAM HCL 5 MG/5ML IJ SOLN
INTRAMUSCULAR | Status: AC
  Filled 2016-05-31: qty 5

## 2016-05-31 MED ORDER — METHYLPREDNISOLONE SODIUM SUCC 125 MG IJ SOLR
INTRAMUSCULAR | Status: AC
  Administered 2016-05-31: 125 mg via INTRAVENOUS
  Filled 2016-05-31: qty 2

## 2016-05-31 MED ORDER — HEPARIN SODIUM (PORCINE) 10000 UNIT/ML IJ SOLN
INTRAMUSCULAR | Status: AC
  Filled 2016-05-31: qty 1

## 2016-05-31 MED ORDER — HEPARIN (PORCINE) IN NACL 2-0.9 UNIT/ML-% IJ SOLN
INTRAMUSCULAR | Status: AC
Start: 2016-05-31 — End: 2016-05-31
  Filled 2016-05-31: qty 500

## 2016-05-31 MED ORDER — DIPHENHYDRAMINE HCL 50 MG/ML IJ SOLN
50.0000 mg | Freq: Once | INTRAMUSCULAR | Status: AC
  Administered 2016-05-31: 50 mg via INTRAVENOUS

## 2016-05-31 MED ORDER — DIPHENHYDRAMINE HCL 50 MG/ML IJ SOLN
INTRAMUSCULAR | Status: AC
  Filled 2016-05-31: qty 1

## 2016-05-31 MED ORDER — SODIUM CHLORIDE 0.9 % IV SOLN
INTRAVENOUS | Status: DC
  Administered 2016-05-31: 05:00:00 via INTRAVENOUS

## 2016-05-31 MED ORDER — FENTANYL CITRATE (PF) 100 MCG/2ML IJ SOLN
INTRAMUSCULAR | Status: DC | PRN
  Administered 2016-05-31: 50 ug via INTRAVENOUS

## 2016-05-31 MED ORDER — MIDAZOLAM HCL 2 MG/2ML IJ SOLN
INTRAMUSCULAR | Status: DC | PRN
  Administered 2016-05-31: 2 mg via INTRAVENOUS

## 2016-05-31 MED ORDER — METHYLPREDNISOLONE SODIUM SUCC 125 MG IJ SOLR
125.0000 mg | Freq: Once | INTRAMUSCULAR | Status: AC
  Administered 2016-05-31: 125 mg via INTRAVENOUS

## 2016-05-31 MED ORDER — FAMOTIDINE 20 MG PO TABS
ORAL_TABLET | ORAL | Status: AC
  Administered 2016-05-31: 40 mg via ORAL
  Filled 2016-05-31: qty 2

## 2016-05-31 SURGICAL SUPPLY — 5 items
CANNULA 5F STIFF (CANNULA) ×3 IMPLANT
CATH PALINDROME RT-P 15FX23CM (CATHETERS) ×3 IMPLANT
PACK ANGIOGRAPHY (CUSTOM PROCEDURE TRAY) ×3 IMPLANT
TOWEL OR 17X26 4PK STRL BLUE (TOWEL DISPOSABLE) ×3 IMPLANT
WIRE J 3MM .035X145CM (WIRE) ×3 IMPLANT

## 2016-05-31 NOTE — Progress Notes (Signed)
[facility]  ROUNDING NOTE   Subjective:  Patient awaiting PermCath placement today. In good spirits. Thereafter he will be having hemodialysis. Probable discharge today.  Objective:  Vital signs in last 24 hours:  Temp:  [97.7 F (36.5 C)-98.1 F (36.7 C)] 97.7 F (36.5 C) (11/08 0511) Pulse Rate:  [71-86] 80 (11/08 0943) Resp:  [17] 17 (11/08 0511) BP: (135-156)/(71-82) 147/71 (11/08 0943) SpO2:  [98 %-100 %] 100 % (11/08 0943) Weight:  [88.8 kg (195 lb 11.2 oz)] 88.8 kg (195 lb 11.2 oz) (11/08 0500)  Weight change: 0.726 kg (1 lb 9.6 oz) Filed Weights   05/28/16 0500 05/30/16 0500 05/31/16 0500  Weight: 86.5 kg (190 lb 12.8 oz) 88 kg (194 lb 1.6 oz) 88.8 kg (195 lb 11.2 oz)    Intake/Output: I/O last 3 completed shifts: In: 523 [P.O.:500; I.V.:23] Out: 0    Intake/Output this shift:  Total I/O In: 3 [I.V.:3] Out: 0   Physical Exam: General: NAD  Head: Normocephalic, atraumatic. Moist oral mucosal membranes  Eyes: Anicteric  Neck: Supple, trachea midline  Lungs:  Clear to auscultation  Heart: S1S2 no rubs  Abdomen:  Soft, nontender, bowel sounds present  Extremities: no peripheral edema.  Neurologic: Nonfocal, moving all four extremities  Skin: No lesions  Access: Maturing left AVF +bruit and thrill    Basic Metabolic Panel:  Recent Labs Lab 05/27/16 0018 05/29/16 0615  NA 130* 134*  K 4.8 4.7  CL 85* 92*  CO2 26 25  GLUCOSE 121* 89  BUN 66* 61*  CREATININE 17.76* 18.53*  CALCIUM 9.0 8.6*    Liver Function Tests:  Recent Labs Lab 05/27/16 0018  AST 38  ALT 44  ALKPHOS 45  BILITOT 0.7  PROT 7.7  ALBUMIN 3.8   No results for input(s): LIPASE, AMYLASE in the last 168 hours. No results for input(s): AMMONIA in the last 168 hours.  CBC:  Recent Labs Lab 05/27/16 0018 05/29/16 0615  WBC 11.6* 6.6  NEUTROABS 9.9*  --   HGB 14.4 12.5*  HCT 42.9 36.7*  MCV 91.0 91.6  PLT 131* 146*    Cardiac Enzymes: No results  for input(s): CKTOTAL, CKMB, CKMBINDEX, TROPONINI in the last 168 hours.  BNP: Invalid input(s): POCBNP  CBG: No results for input(s): GLUCAP in the last 168 hours.  Microbiology: Results for orders placed or performed during the hospital encounter of 05/27/16  Culture, blood (Routine x 2)     Status: Abnormal (Preliminary result)   Collection Time: 05/27/16 12:18 AM  Result Value Ref Range Status   Specimen Description BLOOD BLOOD RIGHT FOREARM  Final   Special Requests BOTTLES DRAWN AEROBIC AND ANAEROBIC 7CCAERO,7CCANA  Final   Culture  Setup Time   Final    GRAM POSITIVE COCCI IN BOTH AEROBIC AND ANAEROBIC BOTTLES CRITICAL RESULT CALLED TO, READ BACK BY AND VERIFIED WITH: SHEEMA HALLAJI AT 1913 ON 05/27/16.Marland Kitchen.Mar[facility] Kitchen.[facility]    Culture STAPHYLOCOCCUS AUREUS (A)  Final   Report Status PENDING  Incomplete   Organism ID, Bacteria STAPHYLOCOCCUS AUREUS  Final      Susceptibility   Staphylococcus aureus - MIC*    CIPROFLOXACIN <=0.5 SENSITIVE Sensitive     ERYTHROMYCIN <=0.25 SENSITIVE Sensitive     GENTAMICIN <=0.5 SENSITIVE Sensitive     OXACILLIN <=0.25 SENSITIVE Sensitive     TETRACYCLINE <=1 SENSITIVE Sensitive     VANCOMYCIN <=0.5 SENSITIVE Sensitive     TRIMETH/SULFA <=10 SENSITIVE Sensitive     CLINDAMYCIN <=0.25 SENSITIVE Sensitive  RIFAMPIN <=0.5 SENSITIVE Sensitive     Inducible Clindamycin NEGATIVE Sensitive     * STAPHYLOCOCCUS AUREUS  Culture, blood (Routine x 2)     Status: Abnormal   Collection Time: 05/27/16 12:18 AM  Result Value Ref Range Status   Specimen Description BLOOD RIGHT FOREARM LATERAL  Final   Special Requests   Final    BOTTLES DRAWN AEROBIC AND ANAEROBIC 12CCAERO,9CCANA   Culture  Setup Time   Final    GRAM POSITIVE COCCI ANAEROBIC BOTTLE ONLY CRITICAL VALUE NOTED.  VALUE IS CONSISTENT WITH PREVIOUSLY REPORTED AND CALLED VALUE. CONFIRMED BY [facility]    Culture (A)  Final    STAPHYLOCOCCUS AUREUS SUSCEPTIBILITIES PERFORMED ON PREVIOUS CULTURE WITHIN THE  LAST 5 DAYS. Performed at [facility]    Report Status 05/30/2016 FINAL  Final  Blood Culture ID Panel (Reflexed)     Status: Abnormal   Collection Time: 05/27/16 12:18 AM  Result Value Ref Range Status   Enterococcus species NOT DETECTED NOT DETECTED Final   Listeria monocytogenes NOT DETECTED NOT DETECTED Final   Staphylococcus species DETECTED (A) NOT DETECTED Corrected    Comment: CRITICAL RESULT CALLED TO, READ BACK BY AND VERIFIED WITH: SHEEMA HALLAJI AT 1913 ON 05/27/16.[facility] CORRECTED ON 11/05 AT 0659: PREVIOUSLY REPORTED AS DETECTED RBV SHEEMA HALLAJI AT 1913 ON 05/27/16.[facility]    Staphylococcus aureus DETECTED (A) NOT DETECTED Corrected    Comment: CRITICAL RESULT CALLED TO, READ BACK BY AND VERIFIED WITH: SHEEMA HALLAJI AT 1913 ON 05/27/16.[facility] KitchenMar[facility] KitchenMMC CORRECTED ON 11/05 AT 0659: PREVIOUSLY REPORTED AS DETECTED RBV SHEEMA HALLAJI AT 1913 ON 05/27/16.[facility]    Streptococcus species NOT DETECTED NOT DETECTED Final   Streptococcus agalactiae NOT DETECTED NOT DETECTED Final   Streptococcus pneumoniae NOT DETECTED NOT DETECTED Final   Streptococcus pyogenes NOT DETECTED NOT DETECTED Final   Acinetobacter baumannii NOT DETECTED NOT DETECTED Final   Enterobacteriaceae species NOT DETECTED NOT DETECTED Final   Enterobacter cloacae complex NOT DETECTED NOT DETECTED Final   Escherichia coli NOT DETECTED NOT DETECTED Final   Klebsiella oxytoca NOT DETECTED NOT DETECTED Final   Klebsiella pneumoniae NOT DETECTED NOT DETECTED Final   Proteus species NOT DETECTED NOT DETECTED Final   Serratia marcescens NOT DETECTED NOT DETECTED Final   Haemophilus influenzae NOT DETECTED NOT DETECTED Final   Neisseria meningitidis NOT DETECTED NOT DETECTED Final   Pseudomonas aeruginosa NOT DETECTED NOT DETECTED Final   Candida albicans NOT DETECTED NOT DETECTED Final   Candida glabrata NOT DETECTED NOT DETECTED Final   Candida krusei NOT DETECTED NOT DETECTED Final   Candida parapsilosis NOT DETECTED  NOT DETECTED Final   Candida tropicalis NOT DETECTED NOT DETECTED Final  MRSA PCR Screening     Status: Abnormal   Collection Time: 05/27/16  4:02 AM  Result Value Ref Range Status   MRSA by PCR (A) NEGATIVE Final    INVALID, UNABLE TO DETERMINE THE PRESENCE OF TARGET DNA DUE TO SPECIMEN INTEGRITY. RECOLLECTION REQUESTED.    Comment: CALLED DAWN SONGSCER ON 05/27/16 AT 0715 BY [facility]  Culture, blood (routine x 2)     Status: None (Preliminary result)   Collection Time: 05/27/16 11:18 AM  Result Value Ref Range Status   Specimen Description BLOOD RIGHT ARM  Final   Special Requests BOTTLES DRAWN AEROBIC AND ANAEROBIC 9CC  Final   Culture NO GROWTH 4 DAYS  Final   Report Status PENDING  Incomplete  Culture, blood (routine x 2)     Status: None (Preliminary result)  Collection Time: 05/27/16 11:18 AM  Result Value Ref Range Status   Specimen Description BLOOD RIGHT HAND  Final   Special Requests BOTTLES DRAWN AEROBIC AND ANAEROBIC 8CC  Final   Culture NO GROWTH 4 DAYS  Final   Report Status PENDING  Incomplete  MRSA PCR Screening     Status: None   Collection Time: 05/29/16 12:15 PM  Result Value Ref Range Status   MRSA by PCR NEGATIVE NEGATIVE Final    Comment:        The GeneXpert MRSA Assay (FDA approved for NASAL specimens only), is one component of a comprehensive MRSA colonization surveillance program. It is not intended to diagnose MRSA infection nor to guide or monitor treatment for MRSA infections.   Culture, blood (single) w Reflex to ID Panel     Status: None (Preliminary result)   Collection Time: 05/29/16  3:15 PM  Result Value Ref Range Status   Specimen Description BLOOD RT HAND  Final   Special Requests BOTTLES DRAWN AEROBIC AND ANAEROBIC 6CC  Final   Culture NO GROWTH 2 DAYS  Final   Report Status PENDING  Incomplete    Coagulation Studies: No results for input(s): LABPROT, INR in the last 72 hours.  Urinalysis: No results for input(s): COLORURINE,  LABSPEC, PHURINE, GLUCOSEU, HGBUR, BILIRUBINUR, KETONESUR, PROTEINUR, UROBILINOGEN, NITRITE, LEUKOCYTESUR in the last 72 hours.  Invalid input(s): APPERANCEUR    Imaging: No results found.   Medications:   . sodium chloride 20 mL/hr at 05/31/16 0511   . aspirin EC  81 mg Oral Daily  . calcium acetate (Phos Binder)  667 mg Oral TID WC  .  ceFAZolin (ANCEF) IV  1 g Intravenous Q24H  . diltiazem  120 mg Oral Daily  . docusate sodium  100 mg Oral BID  . heparin  5,000 Units Subcutaneous Q8H  . lidocaine (PF)  30 mL Intradermal Once  . multivitamin  1 tablet Oral Daily  . sodium chloride flush  3 mL Intravenous Q12H   acetaminophen **OR** acetaminophen, baclofen, chlorproMAZINE (THORAZINE) IV, ondansetron **OR** ondansetron (ZOFRAN) IV, oxyCODONE-acetaminophen, sodium chloride  Assessment/ Plan:  Mr. Burnard BuntingShawn M Geng is a 50 y.o. black male with hypertension, end stage renal disease on hemodialysis.   TTS [facility] [address]   1. End Stage Renal Disease: with complication of dialysis device.  Positive blood cultures as outpatient. 11/2 growing staph aureus.  - repeat blood cultures from 05/27/16 And 05/29/2016 remain negative. Therefore it is safe to place PermCath at this time. This has been scheduled. Thereafter patient will have dialysis today. He will resume his normal outpatient dialysis tomorrow.  2. Hypertension:  Blood pressure currently 147/71. His antihypertensives from home were placed on hold. Continue to monitor as an outpatient.  3. Secondary Hyperparathyroidism with hyperphosphatemia: outpatient PTH 560, phosphorus 6.4 -Continue PhosLo as an outpatient. Obtain repeat serum phosphorus today. Likely to be high given holding of dialysis.  4. Anemia of chronic kidney disease: Epogen remains on hold for hemoglobin of 12.5 which is considered high for a dialysis patient.   LOS: 4 Jacinto Keil 11/8/201711:17 AM

## 2016-05-31 NOTE — Progress Notes (Signed)
Post hd assessment 

## 2016-05-31 NOTE — Discharge Instructions (Signed)
Follow all MD discharge instructions. Take all medications as prescribed. Keep all follow up appointments. If your symptoms return, call your doctor. If you experience any new symptoms that are of concern to you or that are bothersome to you, call your doctor. For all questions and/or concerns, call your doctor.   If you have a medical emergency, call 911   Sepsis, Adult Sepsis is a serious infection of your blood or tissues that affects your whole body. The infection that causes sepsis may be bacterial, viral, fungal, or parasitic. Sepsis may be life threatening. Sepsis can cause your blood pressure to drop. This may result in shock. Shock causes your central nervous system and your organs to stop working correctly.  RISK FACTORS Sepsis can happen in anyone, but it is more likely to happen in people who have weakened immune systems. SIGNS AND SYMPTOMS  Symptoms of sepsis can include:  Fever or low body temperature (hypothermia).  Rapid breathing (hyperventilation).  Chills.  Rapid heartbeat (tachycardia).  Confusion or light-headedness.  Trouble breathing.  Urinating much less than usual.  Cool, clammy skin or red, flushed skin.  Other problems with the heart, kidneys, or brain. DIAGNOSIS  Your health care provider will likely do tests to look for an infection, to see if the infection has spread to your blood, and to see how serious your condition is. Tests can include:  Blood tests, including cultures of your blood.  Cultures of other fluids from your body, such as:  Urine.  Pus from wounds.  Mucus coughed up from your lungs.  Urine tests other than cultures.  X-ray exams or other imaging tests. TREATMENT  Treatment will begin with elimination of the source of infection. If your sepsis is likely caused by a bacterial or fungal infection, you will be given antibiotic or antifungal medicines. You may also receive:  Oxygen.  Fluids through an IV tube.  Medicines to  increase your blood pressure.  A machine to clean your blood (dialysis) if your kidneys fail.  A machine to help you breathe if your lungs fail. SEEK IMMEDIATE MEDICAL CARE IF: You get an infection or develop any of the signs and symptoms of sepsis after surgery or a hospitalization.   This information is not intended to replace advice given to you by your health care provider. Make sure you discuss any questions you have with your health care provider.   Document Released: 04/08/2003 Document Revised: 11/24/2014 Document Reviewed: 03/17/2013 Elsevier Interactive Patient Education [employer]2016 Elsevier Inc.

## 2016-05-31 NOTE — Progress Notes (Signed)
  End of hd 

## 2016-05-31 NOTE — Care Management (Signed)
Ronnie Cox from patient pathways notified of admission

## 2016-05-31 NOTE — Progress Notes (Signed)
Post hd vitals 

## 2016-05-31 NOTE — H&P (Signed)
[facility] History & Physical Update  The patient was interviewed and re-examined.  The patient's previous History and Physical has been reviewed and is unchanged.  There is no change in the plan of care. We plan to proceed with the scheduled procedure.  Festus BarrenJason Dew, MD  05/31/2016, 12:30 PM

## 2016-05-31 NOTE — Progress Notes (Signed)
Pt alert and sitting in recliner. Pt in good spirits and hoping for discharge today. CH is available.   05/31/16 0925  Clinical Encounter Type  Visited With Patient  Visit Type Initial  Referral From Nurse  Spiritual Encounters  Spiritual Needs Emotional  Stress Factors  Patient Stress Factors None identified

## 2016-05-31 NOTE — Op Note (Signed)
OPERATIVE NOTE    PRE-OPERATIVE DIAGNOSIS: 1. ESRD 2. Recent right jugular Permcath infection  POST-OPERATIVE DIAGNOSIS: same as above  PROCEDURE: 1. Ultrasound guidance for vascular access to the left internal jugular vein 2. Fluoroscopic guidance for placement of catheter 3. Placement of a 23 cm tip to cuff tunneled hemodialysis catheter via the left internal jugular vein  SURGEON: Festus BarrenJason Dew, MD  ANESTHESIA:  Local with Moderate conscious sedation for approximately 20 minutes using 2 mg of Versed and 50 mcg of Fentanyl  ESTIMATED BLOOD LOSS: 25 cc  FLUORO TIME: less than one minute  CONTRAST: none  FINDING(S): 1.  Patent left internal jugular vein  SPECIMEN(S):  None  INDICATIONS:   Ronnie Cox is a 50 y.o. male who presents with a Permcath infection. This was removed last week. His AVF needs revision before use.  The patient needs long term dialysis access for their ESRD, and a Permcath is necessary.  Risks and benefits are discussed and informed consent is obtained.    DESCRIPTION: After obtaining full informed written consent, the patient was brought back to the vascular suited. The patient's left neck and chest were sterilely prepped and draped in a sterile surgical field was created. Moderate conscious sedation was administered during a face to face encounter with the patient throughout the procedure with my supervision of the RN administering medicines and monitoring the patient's vital signs, pulse oximetry, telemetry and mental status throughout from the start of the procedure until the patient was taken to the recovery room.  The left internal jugular vein was visualized with ultrasound and found to be patent. It was then accessed under direct ultrasound guidance and a permanent image was recorded. A wire was placed. After skin nick and dilatation, the peel-away sheath was placed over the wire. I then turned my attention to an area under the clavicle. Approximately  1-2 fingerbreadths below the clavicle a small counterincision was created and tunneled from the subclavicular incision to the access site. Using fluoroscopic guidance, a 23 centimeter tip to cuff tunneled hemodialysis catheter was selected, and tunneled from the subclavicular incision to the access site. It was then placed through the peel-away sheath and the peel-away sheath was removed. Using fluoroscopic guidance the catheter tips were parked in the right atrium. The appropriate distal connectors were placed. It withdrew blood well and flushed easily with heparinized saline and a concentrated heparin solution was then placed. It was secured to the chest wall with 2 Prolene sutures. The access incision was closed single 4-0 Monocryl. A 4-0 Monocryl pursestring suture was placed around the exit site. Sterile dressings were placed. The patient tolerated the procedure well and was taken to the recovery room in stable condition.  COMPLICATIONS: None  CONDITION: Stable  Festus BarrenJason Dew  05/31/2016, 2:23 PM   This note was created with Dragon Medical transcription system. Any errors in dictation are purely unintentional.

## 2016-05-31 NOTE — Progress Notes (Signed)
Start of hd 

## 2016-05-31 NOTE — Discharge Summary (Signed)
[facility] [place] [facility]   PATIENT NAME: Ronnie HatchShawn Valencia    MR#:  [id number]  DATE OF BIRTH:  1965-08-16  DATE OF ADMISSION:  05/27/2016   ADMITTING PHYSICIAN: Arnaldo NatalMichael S Diamond, MD  DATE OF DISCHARGE: 05/31/2016 PRIMARY CARE PHYSICIAN: Pcp Not In System   ADMISSION DIAGNOSIS:  Sepsis, due to unspecified organism (HCC) [A41.9] DISCHARGE DIAGNOSIS:  Active Problems:   Sepsis (HCC)   Bacteremia Sepsis with staph aureus bacteremia SECONDARY DIAGNOSIS:   Past Medical History:  Diagnosis Date  . Renal disorder    HOSPITAL COURSE:  This is a 50 year old male admitted for sepsis. 1. Sepsis with staph aureus bacteremia. Likely source of infection is tunneled dialysis catheter. His outpatient blood cultures showed staph aureus.  disontinued vancomycin and Zosyn, started cefazolin. Follow up inpatient blood culture, so far negative. Removed dialysis catheter.  Continue ancef- will need 2 week minimum to be given at HD  per Dr. Sampson GoonFitzgerald.   2. End-stage renal disease: Removed dialysis catheter. Perma catheter placement today and then HD per Dr. Cherylann RatelLateef.  3. Hiccups: Started following vancomycin which the patient states is a common side effect for him. He received baclofen in the emergency department without relief. On a small dose of Thorazine. Improved.  Hyponatremia. Improved. DISCHARGE CONDITIONS:  Stable, discharge to home after hemodialysis  today. CONSULTS OBTAINED:  Treatment Team:  Lamont DowdySarath Kolluru, MD Mick Sellavid P Fitzgerald, MD DRUG ALLERGIES:   Allergies  Allergen Reactions  . Iodine Anaphylaxis   DISCHARGE MEDICATIONS:     Medication List    TAKE these medications   aspirin EC 81 MG tablet Take 81 mg by mouth daily.   baclofen 10 MG tablet Commonly known as:  LIORESAL Take 10 mg by mouth daily as needed for muscle spasms.   calcium acetate (Phos Binder) 667 MG/5ML Soln Commonly known as:  PHOSLYRA Take 667 mg by mouth 3 (three)  times daily with meals.   ceFAZolin 1 GM/50ML Commonly known as:  ANCEF During HD for 2 weeks   DILTZAC PO Take 1 tablet by mouth daily.   multivitamin Tabs tablet Take 1 tablet by mouth daily.        DISCHARGE INSTRUCTIONS:  See AVS.  If you experience worsening of your admission symptoms, develop shortness of breath, life threatening emergency, suicidal or homicidal thoughts you must seek medical attention immediately by calling 911 or calling your MD immediately  if symptoms less severe.  You Must read complete instructions/literature along with all the possible adverse reactions/side effects for all the Medicines you take and that have been prescribed to you. Take any new Medicines after you have completely understood and accpet all the possible adverse reactions/side effects.   Please note  You were cared for by a hospitalist during your hospital stay. If you have any questions about your discharge medications or the care you received while you were in the hospital after you are discharged, you can call the unit and asked to speak with the hospitalist on call if the hospitalist that took care of you is not available. Once you are discharged, your primary care physician will handle any further medical issues. Please note that NO REFILLS for any discharge medications will be authorized once you are discharged, as it is imperative that you return to your primary care physician (or establish a relationship with a primary care physician if you do not have one) for your aftercare needs so that they can reassess your need for medications  and monitor your lab values.    On the day of Discharge:  VITAL SIGNS:  Blood pressure 132/79, pulse 77, temperature 97.9 F (36.6 C), temperature source Oral, resp. rate 12, height 6\' 3"  (1.905 m), weight 195 lb (88.5 kg), SpO2 97 %. PHYSICAL EXAMINATION:  GENERAL:  50 y.o.-year-old patient lying in the bed with no acute distress.  EYES: Pupils  equal, round, reactive to light and accommodation. No scleral icterus. Extraocular muscles intact.  HEENT: Head atraumatic, normocephalic. Oropharynx and nasopharynx clear.  NECK:  Supple, no jugular venous distention. No thyroid enlargement, no tenderness.  LUNGS: Normal breath sounds bilaterally, no wheezing, rales,rhonchi or crepitation. No use of accessory muscles of respiration.  CARDIOVASCULAR: S1, S2 normal. No murmurs, rubs, or gallops.  ABDOMEN: Soft, non-tender, non-distended. Bowel sounds present. No organomegaly or mass.  EXTREMITIES: No pedal edema, cyanosis, or clubbing.  NEUROLOGIC: Cranial nerves II through XII are intact. Muscle strength 5/5 in all extremities. Sensation intact. Gait not checked.  PSYCHIATRIC: The patient is alert and oriented x 3.  SKIN: No obvious rash, lesion, or ulcer.  DATA REVIEW:   CBC  Recent Labs Lab 05/29/16 0615  WBC 6.6  HGB 12.5*  HCT 36.7*  PLT 146*    Chemistries   Recent Labs Lab 05/27/16 0018 05/29/16 0615  NA 130* 134*  K 4.8 4.7  CL 85* 92*  CO2 26 25  GLUCOSE 121* 89  BUN 66* 61*  CREATININE 17.76* 18.53*  CALCIUM 9.0 8.6*  AST 38  --   ALT 44  --   ALKPHOS 45  --   BILITOT 0.7  --      Microbiology Results  Results for orders placed or performed during the hospital encounter of 05/27/16  Culture, blood (Routine x 2)     Status: Abnormal (Preliminary result)   Collection Time: 05/27/16 12:18 AM  Result Value Ref Range Status   Specimen Description BLOOD BLOOD RIGHT FOREARM  Final   Special Requests BOTTLES DRAWN AEROBIC AND ANAEROBIC 7CCAERO,7CCANA  Final   Culture  Setup Time   Final    GRAM POSITIVE COCCI IN BOTH AEROBIC AND ANAEROBIC BOTTLES CRITICAL RESULT CALLED TO, READ BACK BY AND VERIFIED WITH: SHEEMA HALLAJI AT 1913 ON 05/27/16.Marland KitchenMarland KitchenMMC    Culture STAPHYLOCOCCUS AUREUS (A)  Final   Report Status PENDING  Incomplete   Organism ID, Bacteria STAPHYLOCOCCUS AUREUS  Final      Susceptibility    Staphylococcus aureus - MIC*    CIPROFLOXACIN <=0.5 SENSITIVE Sensitive     ERYTHROMYCIN <=0.25 SENSITIVE Sensitive     GENTAMICIN <=0.5 SENSITIVE Sensitive     OXACILLIN <=0.25 SENSITIVE Sensitive     TETRACYCLINE <=1 SENSITIVE Sensitive     VANCOMYCIN <=0.5 SENSITIVE Sensitive     TRIMETH/SULFA <=10 SENSITIVE Sensitive     CLINDAMYCIN <=0.25 SENSITIVE Sensitive     RIFAMPIN <=0.5 SENSITIVE Sensitive     Inducible Clindamycin NEGATIVE Sensitive     * STAPHYLOCOCCUS AUREUS  Culture, blood (Routine x 2)     Status: Abnormal   Collection Time: 05/27/16 12:18 AM  Result Value Ref Range Status   Specimen Description BLOOD RIGHT FOREARM LATERAL  Final   Special Requests   Final    BOTTLES DRAWN AEROBIC AND ANAEROBIC 12CCAERO,9CCANA   Culture  Setup Time   Final    GRAM POSITIVE COCCI ANAEROBIC BOTTLE ONLY CRITICAL VALUE NOTED.  VALUE IS CONSISTENT WITH PREVIOUSLY REPORTED AND CALLED VALUE. CONFIRMED BY [facility]    Culture (A)  Final    STAPHYLOCOCCUS AUREUS SUSCEPTIBILITIES PERFORMED ON PREVIOUS CULTURE WITHIN THE LAST 5 DAYS. [facility]    Report Status 05/30/2016 FINAL  Final  Blood Culture ID Panel (Reflexed)     Status: Abnormal   Collection Time: 05/27/16 12:18 AM  Result Value Ref Range Status   Enterococcus species NOT DETECTED NOT DETECTED Final   Listeria monocytogenes NOT DETECTED NOT DETECTED Final   Staphylococcus species DETECTED (A) NOT DETECTED Corrected    Comment: CRITICAL RESULT CALLED TO, READ BACK BY AND VERIFIED WITH: SHEEMA HALLAJI AT 1913 ON 05/27/16.Marland Kitchen.Marland Kitchen.[facility] CORRECTED ON 11/05 AT 0659: PREVIOUSLY REPORTED AS DETECTED RBV SHEEMA HALLAJI AT 1913 ON 05/27/16.Marland Kitchen.Marland Kitchen.[facility]    Staphylococcus aureus DETECTED (A) NOT DETECTED Corrected    Comment: CRITICAL RESULT CALLED TO, READ BACK BY AND VERIFIED WITH: SHEEMA HALLAJI AT 1913 ON 05/27/16.Marland Kitchen.Marland Kitchen.[facility] CORRECTED ON 11/05 AT 0659: PREVIOUSLY REPORTED AS DETECTED RBV SHEEMA HALLAJI AT 1913 ON 05/27/16.Marland Kitchen.Marland Kitchen.[facility]     Streptococcus species NOT DETECTED NOT DETECTED Final   Streptococcus agalactiae NOT DETECTED NOT DETECTED Final   Streptococcus pneumoniae NOT DETECTED NOT DETECTED Final   Streptococcus pyogenes NOT DETECTED NOT DETECTED Final   Acinetobacter baumannii NOT DETECTED NOT DETECTED Final   Enterobacteriaceae species NOT DETECTED NOT DETECTED Final   Enterobacter cloacae complex NOT DETECTED NOT DETECTED Final   Escherichia coli NOT DETECTED NOT DETECTED Final   Klebsiella oxytoca NOT DETECTED NOT DETECTED Final   Klebsiella pneumoniae NOT DETECTED NOT DETECTED Final   Proteus species NOT DETECTED NOT DETECTED Final   Serratia marcescens NOT DETECTED NOT DETECTED Final   Haemophilus influenzae NOT DETECTED NOT DETECTED Final   Neisseria meningitidis NOT DETECTED NOT DETECTED Final   Pseudomonas aeruginosa NOT DETECTED NOT DETECTED Final   Candida albicans NOT DETECTED NOT DETECTED Final   Candida glabrata NOT DETECTED NOT DETECTED Final   Candida krusei NOT DETECTED NOT DETECTED Final   Candida parapsilosis NOT DETECTED NOT DETECTED Final   Candida tropicalis NOT DETECTED NOT DETECTED Final  MRSA PCR Screening     Status: Abnormal   Collection Time: 05/27/16  4:02 AM  Result Value Ref Range Status   MRSA by PCR (A) NEGATIVE Final    INVALID, UNABLE TO DETERMINE THE PRESENCE OF TARGET DNA DUE TO SPECIMEN INTEGRITY. RECOLLECTION REQUESTED.    Comment: CALLED DAWN SONGSCER ON 05/27/16 AT 0715 [facility]  Culture, blood (routine x 2)     Status: None (Preliminary result)   Collection Time: 05/27/16 11:18 AM  Result Value Ref Range Status   Specimen Description BLOOD RIGHT ARM  Final   Special Requests BOTTLES DRAWN AEROBIC AND ANAEROBIC 9CC  Final   Culture NO GROWTH 4 DAYS  Final   Report Status PENDING  Incomplete  Culture, blood (routine x 2)     Status: None (Preliminary result)   Collection Time: 05/27/16 11:18 AM  Result Value Ref Range Status   Specimen Description BLOOD RIGHT HAND   Final   Special Requests BOTTLES DRAWN AEROBIC AND ANAEROBIC 8CC  Final   Culture NO GROWTH 4 DAYS  Final   Report Status PENDING  Incomplete  MRSA PCR Screening     Status: None   Collection Time: 05/29/16 12:15 PM  Result Value Ref Range Status   MRSA by PCR NEGATIVE NEGATIVE Final    Comment:        The GeneXpert MRSA Assay (FDA approved for NASAL specimens only), is one component of a comprehensive MRSA colonization surveillance  program. It is not intended to diagnose MRSA infection nor to guide or monitor treatment for MRSA infections.   Culture, blood (single) w Reflex to ID Panel     Status: None (Preliminary result)   Collection Time: 05/29/16  3:15 PM  Result Value Ref Range Status   Specimen Description BLOOD RT HAND  Final   Special Requests BOTTLES DRAWN AEROBIC AND ANAEROBIC 6CC  Final   Culture NO GROWTH 2 DAYS  Final   Report Status PENDING  Incomplete    RADIOLOGY:  No results found.   Management plans discussed with the patient, family and they are in agreement.  CODE STATUS:     Code Status Orders        Start     Ordered   05/27/16 0330  Full code  Continuous     05/27/16 0329    Code Status History    Date Active Date Inactive Code Status Order ID Comments User Context   This patient has a current code status but no historical code status.      TOTAL TIME TAKING CARE OF THIS PATIENT: 32 minutes.    Shaune Pollack M.D on 05/31/2016 at 3:00 PM  Between 7am to 6pm - Pager - [phone]  After 6pm go to [email or web address] - Social research officer, government  [facility]  Office  [phone]  CC: Primary care physician; Pcp Not In System   Note: This dictation was prepared with Dragon dictation along with smaller phrase technology. Any transcriptional errors that result from this process are unintentional.

## 2016-05-31 NOTE — Progress Notes (Signed)
Pre hd assessment  

## 2016-05-31 NOTE — Progress Notes (Signed)
Pre-hd tx 

## 2016-06-01 ENCOUNTER — Encounter: Payer: Self-pay | Admitting: Vascular Surgery

## 2016-06-01 LAB — CULTURE, BLOOD (ROUTINE X 2)
CULTURE: NO GROWTH
Culture: NO GROWTH

## 2016-06-01 NOTE — Discharge Summary (Signed)
[employer] [facility] [place] [facility]   PATIENT NAME: Ronnie Cox    MR#:  [id number]  DATE OF BIRTH:  December 28, 1965  DATE OF ADMISSION:  05/27/2016   ADMITTING PHYSICIAN: Arnaldo Natal, MD  DATE OF DISCHARGE: 06/01/2016 PRIMARY CARE PHYSICIAN: Pcp Not In System   ADMISSION DIAGNOSIS:  Sepsis, due to unspecified organism (HCC) [A41.9] DISCHARGE DIAGNOSIS:  Active Problems:   Sepsis (HCC)   Bacteremia Sepsis with staph aureus bacteremia SECONDARY DIAGNOSIS:   Past Medical History:  Diagnosis Date  . Renal disorder    HOSPITAL COURSE:  This is a 50 year old male admitted for sepsis. 1. Sepsis with staph aureus bacteremia. Likely source of infection is tunneled dialysis catheter. His outpatient blood cultures showed staph aureus.  disontinued vancomycin and Zosyn, started cefazolin. Follow up inpatient blood culture, so far negative. Removed dialysis catheter.  Continue ancef- will need 2 week minimum to be given at HD  per Dr. Sampson Goon.   2. End-stage renal disease: Removed dialysis catheter. Perma catheter placement yesterday and then got HD.  3. Hiccups: Started following vancomycin which the patient states is a common side effect for him. He received baclofen in the emergency department without relief. On a small dose of Thorazine. Improved.  Hyponatremia. Improved. DISCHARGE CONDITIONS:  Stable, The patient is discharged to home today. CONSULTS OBTAINED:  Treatment Team:  Lamont Dowdy, MD Mick Sell, MD DRUG ALLERGIES:   Allergies  Allergen Reactions  . Iodine Anaphylaxis   DISCHARGE MEDICATIONS:     Medication List    TAKE these medications   aspirin EC 81 MG tablet Take 81 mg by mouth daily.   baclofen 10 MG tablet Commonly known as:  LIORESAL Take 10 mg by mouth daily as needed for muscle spasms.   calcium acetate (Phos Binder) 667 MG/5ML Soln Commonly known as:  PHOSLYRA Take 667 mg by mouth 3 (three) times  daily with meals.   ceFAZolin 1 GM/50ML Commonly known as:  ANCEF During HD for 2 weeks   DILTZAC PO Take 1 tablet by mouth daily.   multivitamin Tabs tablet Take 1 tablet by mouth daily.        DISCHARGE INSTRUCTIONS:  See AVS.  If you experience worsening of your admission symptoms, develop shortness of breath, life threatening emergency, suicidal or homicidal thoughts you must seek medical attention immediately by calling 911 or calling your MD immediately  if symptoms less severe.  You Must read complete instructions/literature along with all the possible adverse reactions/side effects for all the Medicines you take and that have been prescribed to you. Take any new Medicines after you have completely understood and accpet all the possible adverse reactions/side effects.   Please note  You were cared for by a hospitalist during your hospital stay. If you have any questions about your discharge medications or the care you received while you were in the hospital after you are discharged, you can call the unit and asked to speak with the hospitalist on call if the hospitalist that took care of you is not available. Once you are discharged, your primary care physician will handle any further medical issues. Please note that NO REFILLS for any discharge medications will be authorized once you are discharged, as it is imperative that you return to your primary care physician (or establish a relationship with a primary care physician if you do not have one) for your aftercare needs so that they can reassess your need for medications and monitor  your lab values.    On the day of Discharge:  VITAL SIGNS:  Blood pressure (!) 146/73, pulse 81, temperature 98.2 F (36.8 C), temperature source Oral, resp. rate 18, height 6\' 3"  (1.905 m), weight 193 lb 6.4 oz (87.7 kg), SpO2 98 %. PHYSICAL EXAMINATION:  GENERAL:  50 y.o.-year-old patient lying in the bed with no acute distress.  EYES: Pupils  equal, round, reactive to light and accommodation. No scleral icterus. Extraocular muscles intact.  HEENT: Head atraumatic, normocephalic. Oropharynx and nasopharynx clear.  NECK:  Supple, no jugular venous distention. No thyroid enlargement, no tenderness.  LUNGS: Normal breath sounds bilaterally, no wheezing, rales,rhonchi or crepitation. No use of accessory muscles of respiration.  CARDIOVASCULAR: S1, S2 normal. No murmurs, rubs, or gallops.  ABDOMEN: Soft, non-tender, non-distended. Bowel sounds present. No organomegaly or mass.  EXTREMITIES: No pedal edema, cyanosis, or clubbing.  NEUROLOGIC: Cranial nerves II through XII are intact. Muscle strength 5/5 in all extremities. Sensation intact. Gait not checked.  PSYCHIATRIC: The patient is alert and oriented x 3.  SKIN: No obvious rash, lesion, or ulcer.  DATA REVIEW:   CBC  Recent Labs Lab 05/29/16 0615  WBC 6.6  HGB 12.5*  HCT 36.7*  PLT 146*    Chemistries   Recent Labs Lab 05/27/16 0018 05/29/16 0615  NA 130* 134*  K 4.8 4.7  CL 85* 92*  CO2 26 25  GLUCOSE 121* 89  BUN 66* 61*  CREATININE 17.76* 18.53*  CALCIUM 9.0 8.6*  AST 38  --   ALT 44  --   ALKPHOS 45  --   BILITOT 0.7  --      Microbiology Results  Results for orders placed or performed during the hospital encounter of 05/27/16  Culture, blood (Routine x 2)     Status: Abnormal   Collection Time: 05/27/16 12:18 AM  Result Value Ref Range Status   Specimen Description BLOOD BLOOD RIGHT FOREARM  Final   Special Requests BOTTLES DRAWN AEROBIC AND ANAEROBIC 7CCAERO,7CCANA  Final   Culture  Setup Time   Final    GRAM POSITIVE COCCI IN BOTH AEROBIC AND ANAEROBIC BOTTLES CRITICAL RESULT CALLED TO, READ BACK BY AND VERIFIED WITH: SHEEMA HALLAJI AT 1913 ON 05/27/16.Marland Kitchen.Marland Kitchen.[facility]    Culture STAPHYLOCOCCUS AUREUS (A)  Final   Report Status 06/01/2016 FINAL  Final   Organism ID, Bacteria STAPHYLOCOCCUS AUREUS  Final      Susceptibility   Staphylococcus aureus -  MIC*    CIPROFLOXACIN <=0.5 SENSITIVE Sensitive     ERYTHROMYCIN <=0.25 SENSITIVE Sensitive     GENTAMICIN <=0.5 SENSITIVE Sensitive     OXACILLIN <=0.25 SENSITIVE Sensitive     TETRACYCLINE <=1 SENSITIVE Sensitive     VANCOMYCIN <=0.5 SENSITIVE Sensitive     TRIMETH/SULFA <=10 SENSITIVE Sensitive     CLINDAMYCIN <=0.25 SENSITIVE Sensitive     RIFAMPIN <=0.5 SENSITIVE Sensitive     Inducible Clindamycin NEGATIVE Sensitive     * STAPHYLOCOCCUS AUREUS  Culture, blood (Routine x 2)     Status: Abnormal   Collection Time: 05/27/16 12:18 AM  Result Value Ref Range Status   Specimen Description BLOOD RIGHT FOREARM LATERAL  Final   Special Requests   Final    BOTTLES DRAWN AEROBIC AND ANAEROBIC 12CCAERO,9CCANA   Culture  Setup Time   Final    GRAM POSITIVE COCCI ANAEROBIC BOTTLE ONLY CRITICAL VALUE NOTED.  VALUE IS CONSISTENT WITH PREVIOUSLY REPORTED AND CALLED VALUE. CONFIRMED BY [facility]    Culture (A)  Final    STAPHYLOCOCCUS AUREUS SUSCEPTIBILITIES PERFORMED ON PREVIOUS CULTURE WITHIN THE LAST 5 DAYS. [facility]    Report Status 05/30/2016 FINAL  Final  Blood Culture ID Panel (Reflexed)     Status: Abnormal   Collection Time: 05/27/16 12:18 AM  Result Value Ref Range Status   Enterococcus species NOT DETECTED NOT DETECTED Final   Listeria monocytogenes NOT DETECTED NOT DETECTED Final   Staphylococcus species DETECTED (A) NOT DETECTED Corrected    Comment: CRITICAL RESULT CALLED TO, READ BACK BY AND VERIFIED WITH: SHEEMA HALLAJI AT 1913 ON 05/27/16.Marland Kitchen.Marland Kitchen.[facility] CORRECTED ON 11/05 AT 0659: PREVIOUSLY REPORTED AS DETECTED RBV SHEEMA HALLAJI AT 1913 ON 05/27/16.Marland Kitchen.Marland Kitchen.[facility]    Staphylococcus aureus DETECTED (A) NOT DETECTED Corrected    Comment: CRITICAL RESULT CALLED TO, READ BACK BY AND VERIFIED WITH: SHEEMA HALLAJI AT 1913 ON 05/27/16.Marland Kitchen.Marland Kitchen.[facility] CORRECTED ON 11/05 AT 0659: PREVIOUSLY REPORTED AS DETECTED RBV SHEEMA HALLAJI AT 1913 ON 05/27/16.Marland Kitchen.Marland Kitchen.[facility]    Streptococcus species NOT  DETECTED NOT DETECTED Final   Streptococcus agalactiae NOT DETECTED NOT DETECTED Final   Streptococcus pneumoniae NOT DETECTED NOT DETECTED Final   Streptococcus pyogenes NOT DETECTED NOT DETECTED Final   Acinetobacter baumannii NOT DETECTED NOT DETECTED Final   Enterobacteriaceae species NOT DETECTED NOT DETECTED Final   Enterobacter cloacae complex NOT DETECTED NOT DETECTED Final   Escherichia coli NOT DETECTED NOT DETECTED Final   Klebsiella oxytoca NOT DETECTED NOT DETECTED Final   Klebsiella pneumoniae NOT DETECTED NOT DETECTED Final   Proteus species NOT DETECTED NOT DETECTED Final   Serratia marcescens NOT DETECTED NOT DETECTED Final   Haemophilus influenzae NOT DETECTED NOT DETECTED Final   Neisseria meningitidis NOT DETECTED NOT DETECTED Final   Pseudomonas aeruginosa NOT DETECTED NOT DETECTED Final   Candida albicans NOT DETECTED NOT DETECTED Final   Candida glabrata NOT DETECTED NOT DETECTED Final   Candida krusei NOT DETECTED NOT DETECTED Final   Candida parapsilosis NOT DETECTED NOT DETECTED Final   Candida tropicalis NOT DETECTED NOT DETECTED Final  MRSA PCR Screening     Status: Abnormal   Collection Time: 05/27/16  4:02 AM  Result Value Ref Range Status   MRSA by PCR (A) NEGATIVE Final    INVALID, UNABLE TO DETERMINE THE PRESENCE OF TARGET DNA DUE TO SPECIMEN INTEGRITY. RECOLLECTION REQUESTED.    Comment: CALLED DAWN SONGSCER ON 05/27/16 AT 0715 BY [facility]  Culture, blood (routine x 2)     Status: None   Collection Time: 05/27/16 11:18 AM  Result Value Ref Range Status   Specimen Description BLOOD RIGHT ARM  Final   Special Requests BOTTLES DRAWN AEROBIC AND ANAEROBIC 9CC  Final   Culture NO GROWTH 5 DAYS  Final   Report Status 06/01/2016 FINAL  Final  Culture, blood (routine x 2)     Status: None   Collection Time: 05/27/16 11:18 AM  Result Value Ref Range Status   Specimen Description BLOOD RIGHT HAND  Final   Special Requests BOTTLES DRAWN AEROBIC AND ANAEROBIC 8CC   Final   Culture NO GROWTH 5 DAYS  Final   Report Status 06/01/2016 FINAL  Final  MRSA PCR Screening     Status: None   Collection Time: 05/29/16 12:15 PM  Result Value Ref Range Status   MRSA by PCR NEGATIVE NEGATIVE Final    Comment:        The GeneXpert MRSA Assay (FDA approved for NASAL specimens only), is one component of a comprehensive MRSA colonization surveillance program. It  is not intended to diagnose MRSA infection nor to guide or monitor treatment for MRSA infections.   Culture, blood (single) w Reflex to ID Panel     Status: None (Preliminary result)   Collection Time: 05/29/16  3:15 PM  Result Value Ref Range Status   Specimen Description BLOOD RT HAND  Final   Special Requests BOTTLES DRAWN AEROBIC AND ANAEROBIC 6CC  Final   Culture NO GROWTH 3 DAYS  Final   Report Status PENDING  Incomplete    RADIOLOGY:  No results found.   Management plans discussed with the patient, family and they are in agreement.  CODE STATUS:     Code Status Orders        Start     Ordered   05/27/16 0330  Full code  Continuous     05/27/16 0329    Code Status History    Date Active Date Inactive Code Status Order ID Comments User Context   This patient has a current code status but no historical code status.      TOTAL TIME TAKING CARE OF THIS PATIENT: 32 minutes.    Shaune Pollack M.D on 06/01/2016 at 2:15 PM  Between 7am to 6pm - Pager - [phone]  After 6pm go to [email or web address] - Social research officer, government  [facility] [place] [facility]  Office  [phone]  CC: Primary care physician; Pcp Not In System   Note: This dictation was prepared with Dragon dictation along with smaller phrase technology. Any transcriptional errors that result from this process are unintentional.

## 2016-06-02 ENCOUNTER — Emergency Department
Admission: EM | Admit: 2016-06-02 | Discharge: 2016-06-02 | Disposition: A | Discharge status: Home / Self Care | Payer: Medicare Other | Attending: Emergency Medicine | Admitting: Emergency Medicine

## 2016-06-02 DIAGNOSIS — Y733 Surgical instruments, materials and gastroenterology and urology devices (including sutures) associated with adverse incidents: Secondary | ICD-10-CM | POA: Diagnosis not present

## 2016-06-02 DIAGNOSIS — T82838A Hemorrhage of vascular prosthetic devices, implants and grafts, initial encounter: Secondary | ICD-10-CM | POA: Diagnosis present

## 2016-06-02 DIAGNOSIS — Z7982 Long term (current) use of aspirin: Secondary | ICD-10-CM | POA: Insufficient documentation

## 2016-06-02 DIAGNOSIS — Z79899 Other long term (current) drug therapy: Secondary | ICD-10-CM | POA: Insufficient documentation

## 2016-06-02 NOTE — ED Triage Notes (Signed)
Pt c/o pain/bleeding at portacath site. Pt reports new port was placed on left upper chest on Wednesday. Pt c/o pain at site that has been gradually increasing since placement. Pt has oozing of blood at site. Pt had portacath removed from right should on Wednesday due to infection.

## 2016-06-02 NOTE — ED Notes (Signed)
Site cleaned and dressing applied. Pt not complaining of pain. VS stable.

## 2016-06-02 NOTE — ED Provider Notes (Signed)
[facility] Emergency Department Provider Note  ____________________________________________  Time seen: Approximately 6:47 AM  I have reviewed the triage vital signs and the nursing notes.   HISTORY  Chief Complaint Vascular Access Problem    HPI Ronnie Cox is a 50 y.o. male who reports waking up and finding that he was bleeding from the left side of his chest. He had a tunneled left internal jugular catheter placed 2 days ago by vascular surgery for dialysis access. He was recovering well, until this morning when he woke up with a shirt cover with blood. Denies chest pain shortness of breath fevers or chills. No other acute complaints.     Past Medical History:  Diagnosis Date  . Renal disorder      Patient Active Problem List   Diagnosis Date Noted  . Bacteremia 05/28/2016  . Sepsis (HCC) 05/27/2016     Past Surgical History:  Procedure Laterality Date  . PERIPHERAL VASCULAR CATHETERIZATION N/A 05/31/2016   Procedure: Dialysis/Perma Catheter Insertion;  Surgeon: Annice NeedyJason S Dew, MD;  Location: [facility] INVASIVE CV LAB;  Service: Cardiovascular;  Laterality: N/A;  . TUNNELED VENOUS PORT PLACEMENT       Prior to Admission medications   Medication Sig Start Date End Date Taking? Authorizing Provider  aspirin EC 81 MG tablet Take 81 mg by mouth daily.    Historical Provider, MD  baclofen (LIORESAL) 10 MG tablet Take 10 mg by mouth daily as needed for muscle spasms.    Historical Provider, MD  calcium acetate, Phos Binder, (PHOSLYRA) 667 MG/5ML SOLN Take 667 mg by mouth 3 (three) times daily with meals.    Historical Provider, MD  ceFAZolin (ANCEF) 1 GM/50ML During HD for 2 weeks 05/31/16   Shaune PollackQing Chen, MD  DilTIAZem HCl ER Beads (DILTZAC PO) Take 1 tablet by mouth daily.    Historical Provider, MD  multivitamin (RENA-VIT) TABS tablet Take 1 tablet by mouth daily.    Historical Provider, MD     Allergies Iodine   Family History  Problem  Relation Age of Onset  . Renal Disease Mother     Social History Social History  Substance Use Topics  . Smoking status: Never Smoker  . Smokeless tobacco: Never Used  . Alcohol use No    Review of Systems  Constitutional:   No fever or chills.   Cardiovascular:   No chest pain. Respiratory:   No dyspnea or cough. Musculoskeletal:   Negative for focal pain or swelling Neurological:   Negative for headaches 10-point ROS otherwise negative.  ____________________________________________   PHYSICAL EXAM:  VITAL SIGNS: ED Triage Vitals  Enc Vitals Group     BP 06/02/16 0624 125/88     Pulse Rate 06/02/16 0624 91     Resp 06/02/16 0624 18     Temp 06/02/16 0624 98.2 F (36.8 C)     Temp Source 06/02/16 0624 Oral     SpO2 06/02/16 0624 96 %     Weight 06/02/16 0626 192 lb (87.1 kg)     Height 06/02/16 0626 6\' 3"  (1.905 m)     Head Circumference --      Peak Flow --      Pain Score 06/02/16 0627 10     Pain Loc --      Pain Edu? --      Excl. in GC? --     Vital signs reviewed, nursing assessments reviewed.   Constitutional:   Alert and oriented. Well appearing and  in no distress. Eyes:   No scleral icterus. No conjunctival pallor. EOMI.   ENT   Head:   Normocephalic and atraumatic.   Nose:   No congestion/rhinnorhea. No septal hematoma   Mouth/Throat:   MMM, no pharyngeal erythema. No peritonsillar mass.    Neck:   No stridor. No SubQ emphysema. No meningismus. Hematological/Lymphatic/Immunilogical:   No cervical lymphadenopathy. Cardiovascular:   RRR. Symmetric bilateral radial and DP pulses.  No murmurs.  Respiratory:   Normal respiratory effort without tachypnea nor retractions. Breath sounds are clear and equal bilaterally. No wheezes/rales/rhonchi. Gastrointestinal:   Soft and nontender. Non distended. There is no CVA tenderness.  No rebound, rigidity, or guarding.  Neurologic:   Normal speech and language.  CN 2-10 normal. Motor grossly  intact. No gross focal neurologic deficits are appreciated.  Skin:    Skin is warm, dry.  There is a tunneled IJ catheter protruding from the left upper chest, skin anchor sutures in place. From the skin exit site, there is a small amount of pulsatile bleeding. Unclear the source of this but the rate of bleeding and response to skin manipulation suggests that it is a small cutaneous artery.. ____________________________________________    LABS (pertinent positives/negatives) (all labs ordered are listed, but only abnormal results are displayed) Labs Reviewed - No data to display ____________________________________________   EKG    ____________________________________________    RADIOLOGY    ____________________________________________   PROCEDURES Procedures Attempted control bleeding Direct pressure held for 5 minutes with skin manipulation to isolate source of bleeding. Bleeding recurred immediately upon release of pressure. Attempted Surgicel without success ____________________________________________   INITIAL IMPRESSION / ASSESSMENT AND PLAN / ED COURSE  Pertinent labs & imaging results that were available during my care of the patient were reviewed by me and considered in my medical decision making (see chart for details).  Patient well appearing no acute distress. Has persistent bleeding through the left chest tunneled IJ skin exit site not responsive to direct pressure and Surgicel. Discussed with Dr. Gilda CreaseSchnier who will see the patient in the ED to control the bleeding. I anticipate that after hemostasis is achieved the patient will be suitable for discharge home and outpatient follow-up. Case signed out to Dr. Don PerkingVeronese to follow up on vascular surgery recommendations.     Clinical Course    ____________________________________________   FINAL CLINICAL IMPRESSION(S) / ED DIAGNOSES  Final diagnoses:  Bleeding due to dialysis catheter placement, initial  encounter [facility](HCC)       Portions of this note were generated with dragon dictation software. Dictation errors may occur despite best attempts at proofreading.    Sharman CheekPhillip Darlen Gledhill, MD 06/02/16 [phone]

## 2016-06-02 NOTE — ED Notes (Signed)
ED Provider Stafford at bedside. 

## 2016-06-02 NOTE — Consult Note (Signed)
[facility] Vascular Consult Note  MRN : [id number]  Ronnie Cox is a 50 y.o. (June 06, 1966) male who presents with chief complaint of  Chief Complaint  Patient presents with  . Vascular Access Problem  .  History of Present Illness: I have asked to evaluate the patient by Dr. Scotty CourtStafford. Patient is in the emergency room he presented earlier this morning awakening to find a significant amount of blood on his bed clothes and chest. He realized this was coming from the catheter and subsequently was brought to the emergency room. Evaluation by the ER staff demonstrated bleeding from around the catheter at the skin exit site.  Catheter was placed approximately 3 days ago and has been used twice for dialysis without incident. Patient denies pain at the catheter site. He denies fever or chills.  There is no report of hypotension or loss of consciousness.  No current facility-administered medications for this encounter.    Current Outpatient Prescriptions  Medication Sig Dispense Refill  . aspirin EC 81 MG tablet Take 81 mg by mouth daily.    . baclofen (LIORESAL) 10 MG tablet Take 10 mg by mouth daily as needed for muscle spasms.    . calcium acetate, Phos Binder, (PHOSLYRA) 667 MG/5ML SOLN Take 667 mg by mouth 3 (three) times daily with meals.    Marland Kitchen. ceFAZolin (ANCEF) 1 GM/50ML During HD for 2 weeks 300 mL 0  . DilTIAZem HCl ER Beads (DILTZAC PO) Take 1 tablet by mouth daily.    . multivitamin (RENA-VIT) TABS tablet Take 1 tablet by mouth daily.      Past Medical History:  Diagnosis Date  . Renal disorder     Past Surgical History:  Procedure Laterality Date  . PERIPHERAL VASCULAR CATHETERIZATION N/A 05/31/2016   Procedure: Dialysis/Perma Catheter Insertion;  Surgeon: Annice NeedyJason S Dew, MD;  Location: [facility] INVASIVE CV LAB;  Service: Cardiovascular;  Laterality: N/A;  . TUNNELED VENOUS PORT PLACEMENT      Social History Social History  Substance Use Topics  .  Smoking status: Never Smoker  . Smokeless tobacco: Never Used  . Alcohol use No    Family History Family History  Problem Relation Age of Onset  . Renal Disease Mother   No family history of bleeding clotting disorders, porphyria or autoimmune disease  Allergies  Allergen Reactions  . Iodine Anaphylaxis     REVIEW OF SYSTEMS (Negative unless checked)  Constitutional: [] Weight loss  [] Fever  [] Chills Cardiac: [] Chest pain   [] Chest pressure   [] Palpitations   [] Shortness of breath when laying flat   [] Shortness of breath at rest   [] Shortness of breath with exertion. Vascular:  [] Pain in legs with walking   [] Pain in legs at rest   [] Pain in legs when laying flat   [] Claudication   [] Pain in feet when walking  [] Pain in feet at rest  [] Pain in feet when laying flat   [] History of DVT   [] Phlebitis   [] Swelling in legs   [] Varicose veins   [] Non-healing ulcers Pulmonary:   [] Uses home oxygen   [] Productive cough   [] Hemoptysis   [] Wheeze  [] COPD   [] Asthma Neurologic:  [] Dizziness  [] Blackouts   [] Seizures   [] History of stroke   [] History of TIA  [] Aphasia   [] Temporary blindness   [] Dysphagia   [] Weakness or numbness in arms   [] Weakness or numbness in legs Musculoskeletal:  [] Arthritis   [] Joint swelling   [] Joint pain   [] Low back  pain Hematologic:  [] Easy bruising  [] Easy bleeding   [] Hypercoagulable state   [] Anemic  [] Hepatitis Gastrointestinal:  [] Blood in stool   [] Vomiting blood  [] Gastroesophageal reflux/heartburn   [] Difficulty swallowing. Genitourinary:  [] Chronic kidney disease   [] Difficult urination  [] Frequent urination  [] Burning with urination   [] Blood in urine Skin:  [] Rashes   [] Ulcers   [] Wounds Psychological:  [] History of anxiety   []  History of major depression.  Physical Examination  Vitals:   06/02/16 0730 06/02/16 0830 06/02/16 0900 06/02/16 0930  BP: 126/77 113/73  124/83  Pulse: 82 80 82 82  Resp: 16 16 16 16   Temp:      TempSrc:      SpO2: 96% 98%  98% 98%  Weight:      Height:       Body mass index is 24 kg/m. Gen:  WD/WN, NAD Head: [place]/AT, No temporalis wasting. Prominent temp pulse not noted. Ear/Nose/Throat: Hearing grossly intact, nares w/o erythema or drainage, oropharynx w/o Erythema/Exudate Eyes: Sclera non-icteric, conjunctiva clear Neck: Trachea midline.  No JVD.  Pulmonary:  Good air movement, respirations not labored, equal bilaterally.  Cardiac: RRR, normal S1, S2. Vascular: There is a small amount of bleeding at the exit site of the catheter. There is no hematoma. The catheter is nontender. It otherwise is intact. Vessel Right Left  Radial Palpable Palpable  Ulnar Palpable Palpable  Brachial Palpable Palpable  Gastrointestinal: soft, non-tender/non-distended. No guarding/reflex.  Musculoskeletal: M/S 5/5 throughout.  Extremities without ischemic changes.  No deformity or atrophy. No edema. Neurologic: Sensation grossly intact in extremities.  Symmetrical.  Speech is fluent. Motor exam as listed above. Psychiatric: Judgment intact, Mood & affect appropriate for pt's clinical situation. Dermatologic: No rashes or ulcers noted.  No cellulitis or open wounds. Lymph : No Cervical, Axillary, or Inguinal lymphadenopathy.    CBC Lab Results  Component Value Date   WBC 6.6 05/29/2016   HGB 12.5 (L) 05/29/2016   HCT 36.7 (L) 05/29/2016   MCV 91.6 05/29/2016   PLT 146 (L) 05/29/2016    BMET    Component Value Date/Time   NA 134 (L) 05/29/2016 0615   K 4.7 05/29/2016 0615   CL 92 (L) 05/29/2016 0615   CO2 25 05/29/2016 0615   GLUCOSE 89 05/29/2016 0615   BUN 61 (H) 05/29/2016 0615   CREATININE 18.53 (H) 05/29/2016 0615   CALCIUM 8.6 (L) 05/29/2016 0615   GFRNONAA 3 (L) 05/29/2016 0615   GFRAA 3 (L) 05/29/2016 0615   Estimated Creatinine Clearance: 5.9 mL/min (by C-G formula based on SCr of 18.53 mg/dL (H)).  COAG No results found for: INR, PROTIME  Radiology Dg Chest 2 View  Result Date:  05/27/2016 CLINICAL DATA:  Acute onset of nausea and vomiting. Fever. Burning sensation at the patient's tunneled catheter. Initial encounter. EXAM: CHEST  2 VIEW COMPARISON:  None. FINDINGS: The lungs are well-aerated. Vascular congestion is noted. A right-sided dual-lumen catheter is noted ending about the distal SVC. There is no evidence of focal opacification, pleural effusion or pneumothorax. The heart is normal in size; the mediastinal contour is within normal limits. No acute osseous abnormalities are seen. IMPRESSION: Vascular congestion noted. Lungs remain grossly clear. Right-sided dual-lumen catheter noted ending about the distal SVC. Electronically Signed   By: Roanna Raider M.D.   On: 05/27/2016 00:53      Assessment/Plan 1. Consultation of vascular access: Patient will have a pursestring suture placed around the exit site and this will tamponade the  bleeding. He will continue with his dialysis as scheduled. The risks and benefits were reviewed with the patient all questions answered patient agrees to proceed 2. End-stage renal disease on hemodialysis: Patient will continue on his usual schedule without missing dialysis. He is due to be back in the office next Wednesday to continue his assessment for upper extremity access with Dr. Wyn Quakerew. 3. Hypertension: Patient will continue his hypertensive medicines he did not lose a significant amount of blood and therefore his antihypertensive do not need to be altered at this time.   Levora DredgeGregory Anastazja Isaac, MD  06/02/2016 12:08 PM    This note was created with Dragon medical transcription system.  Any error is purely unintentional

## 2016-06-02 NOTE — ED Notes (Signed)
Surgeon at bedside.  

## 2016-06-02 NOTE — Op Note (Signed)
[facility]  Percutaneous Study/Intervention Procedural Note   Date of Surgery: 06/02/2016,12:15 PM  Surgeon:Ariyannah Pauling, Latina CraverGregory G   Pre-operative Diagnosis: Bleeding from tunneled dialysis catheter; end-stage renal disease on hemodialysis  Post-operative diagnosis:  Same  Procedure(s) Performed:  1.  Placement of a pursestring suture to control hemorrhage at the tunneled dialysis catheter   Anesthesia: 1% lidocaine with epinephrine  Procedure:  [name] [name] who was identified and appropriate procedural time out was performed.  The patient was then placed supine on the table and prepped and draped in the usual sterile fashion.  1% lidocaine with epinephrine is then infiltrated into the surrounding soft tissues at the exit site of the catheter. An 0 Prolene is then used to create a figure-of-eight suture surrounding the exit site and a quasi-pursestring fashion. It is secured without difficulty. The wound is then inspected and hemostasis has been achieved and a new sterile dressing is placed.  Disposition: Patient tolerated the procedure well.  [name] 06/02/2016,12:15 PM

## 2016-06-02 NOTE — ED Notes (Signed)
Surgicel applied to portacath site by MD Scotty CourtStafford. Pt applying pressure to site [facility].

## 2016-06-02 NOTE — ED Provider Notes (Signed)
-----------------------------------------   9:26 AM on 06/02/2016 -----------------------------------------   Blood pressure 113/73, pulse 80, temperature 98.2 F (36.8 C), temperature source Oral, resp. rate 16, height 6\' 3"  (1.905 m), weight 192 lb (87.1 kg), SpO2 98 %.  Assuming care from Dr. Scotty CourtStafford of Burnard BuntingShawn M Adachi is a 50 y.o. male with a chief complaint of Vascular Access Problem .    Patient presented with a small superficial arterial bleed surrounding the site of tunneled IJ catheter. Patient was evaluated by Dr. Gilda CreaseSchnier was able to control the bleeding and achieve hemostasis. Patient was observed with no further bleeding. Patient will be discharged at this time   [place] Lilit Cinelli, MD 06/02/16 [phone]

## 2016-06-02 NOTE — ED Notes (Signed)
Pt given breakfast.

## 2016-06-03 LAB — CULTURE, BLOOD (SINGLE): Culture: NO GROWTH

## 2016-07-10 ENCOUNTER — Encounter (INDEPENDENT_AMBULATORY_CARE_PROVIDER_SITE_OTHER): Payer: Medicare Other

## 2016-07-10 ENCOUNTER — Encounter (INDEPENDENT_AMBULATORY_CARE_PROVIDER_SITE_OTHER): Payer: Medicare Other | Admitting: Vascular Surgery

## 2017-09-30 IMAGING — CR DG CHEST 2V
2 series · 2 of 2 positions shown · non-contrast
Comparison: None.

CLINICAL DATA: Acute onset of nausea and vomiting. Fever. Burning
sensation at the patient's tunneled catheter. Initial encounter.

EXAM:
CHEST  2 VIEW

[chest pa]
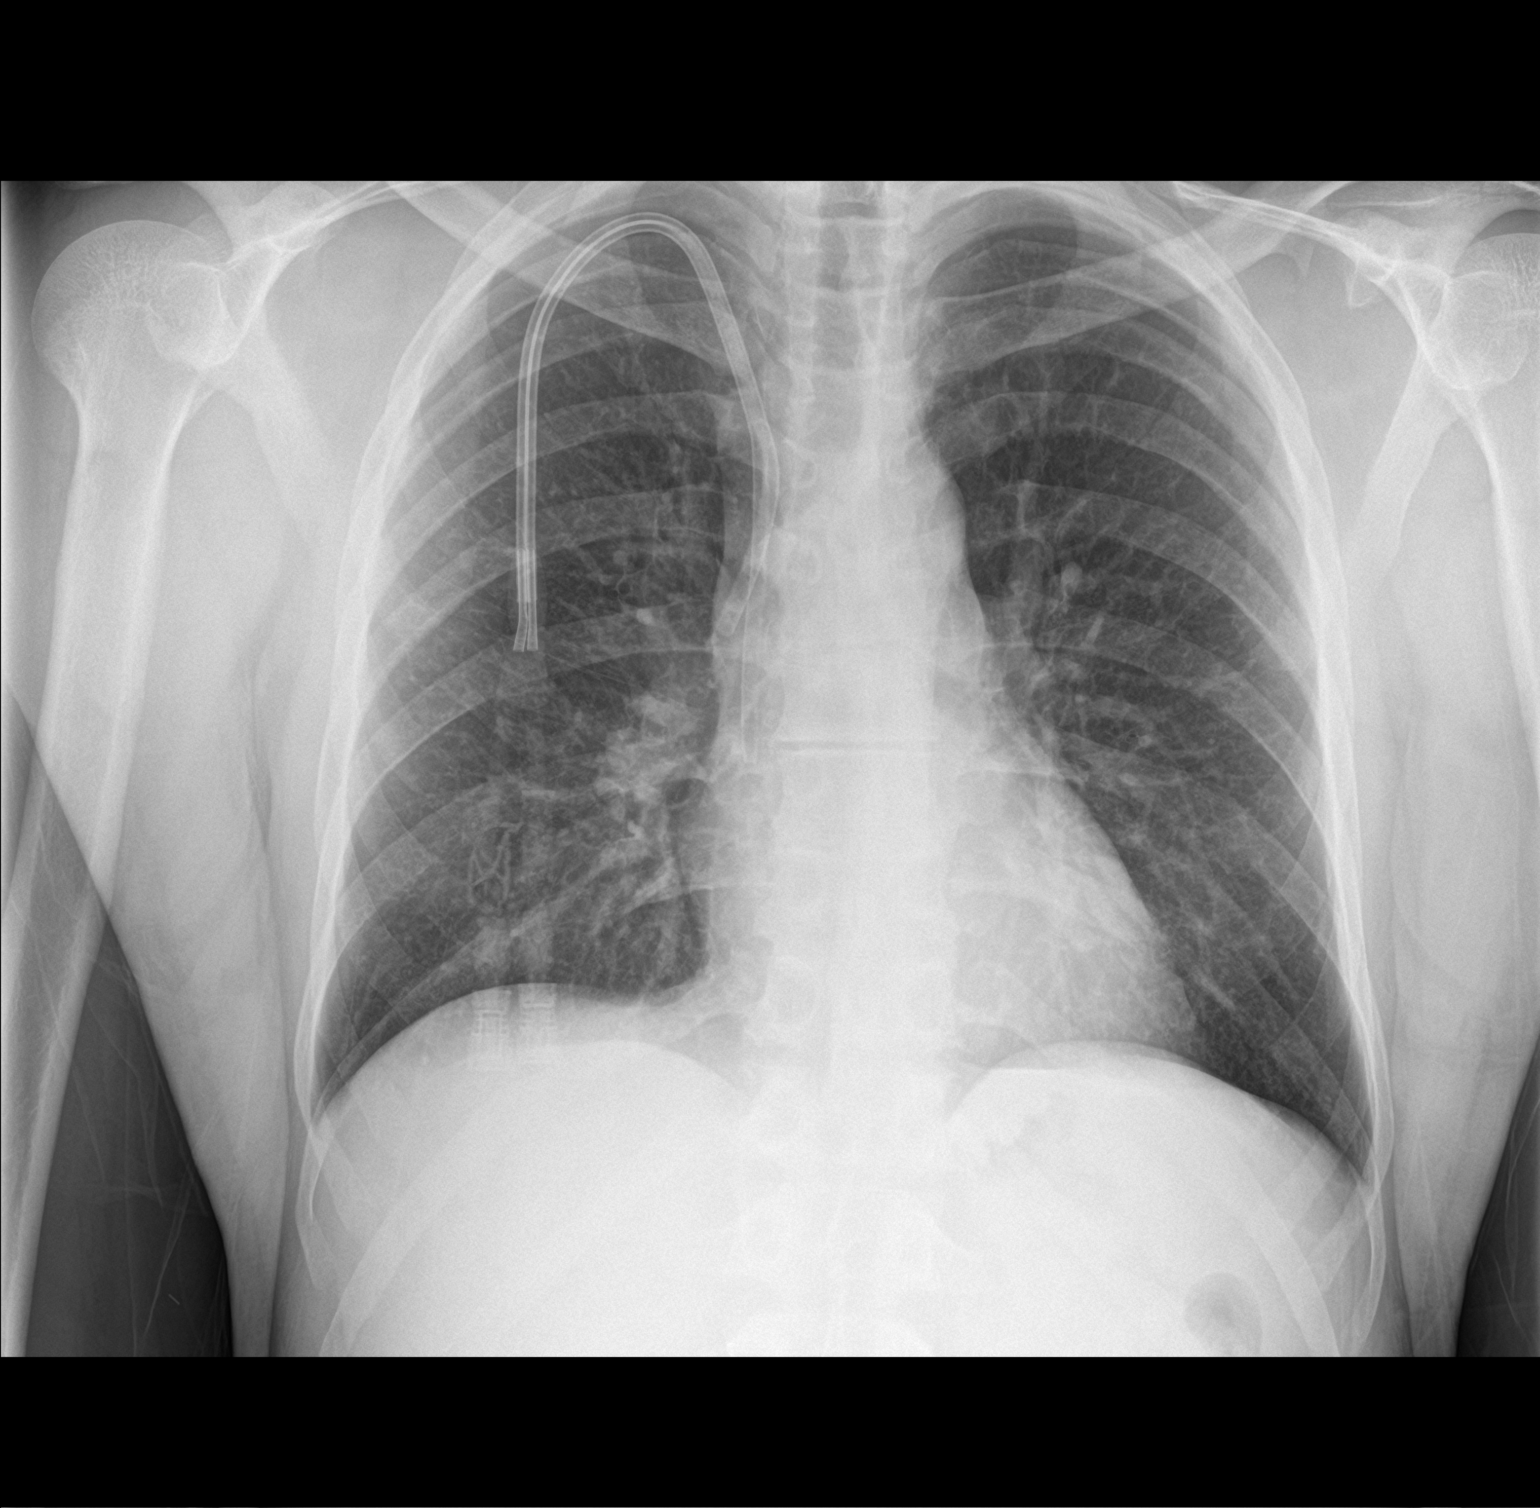

[chest lat]
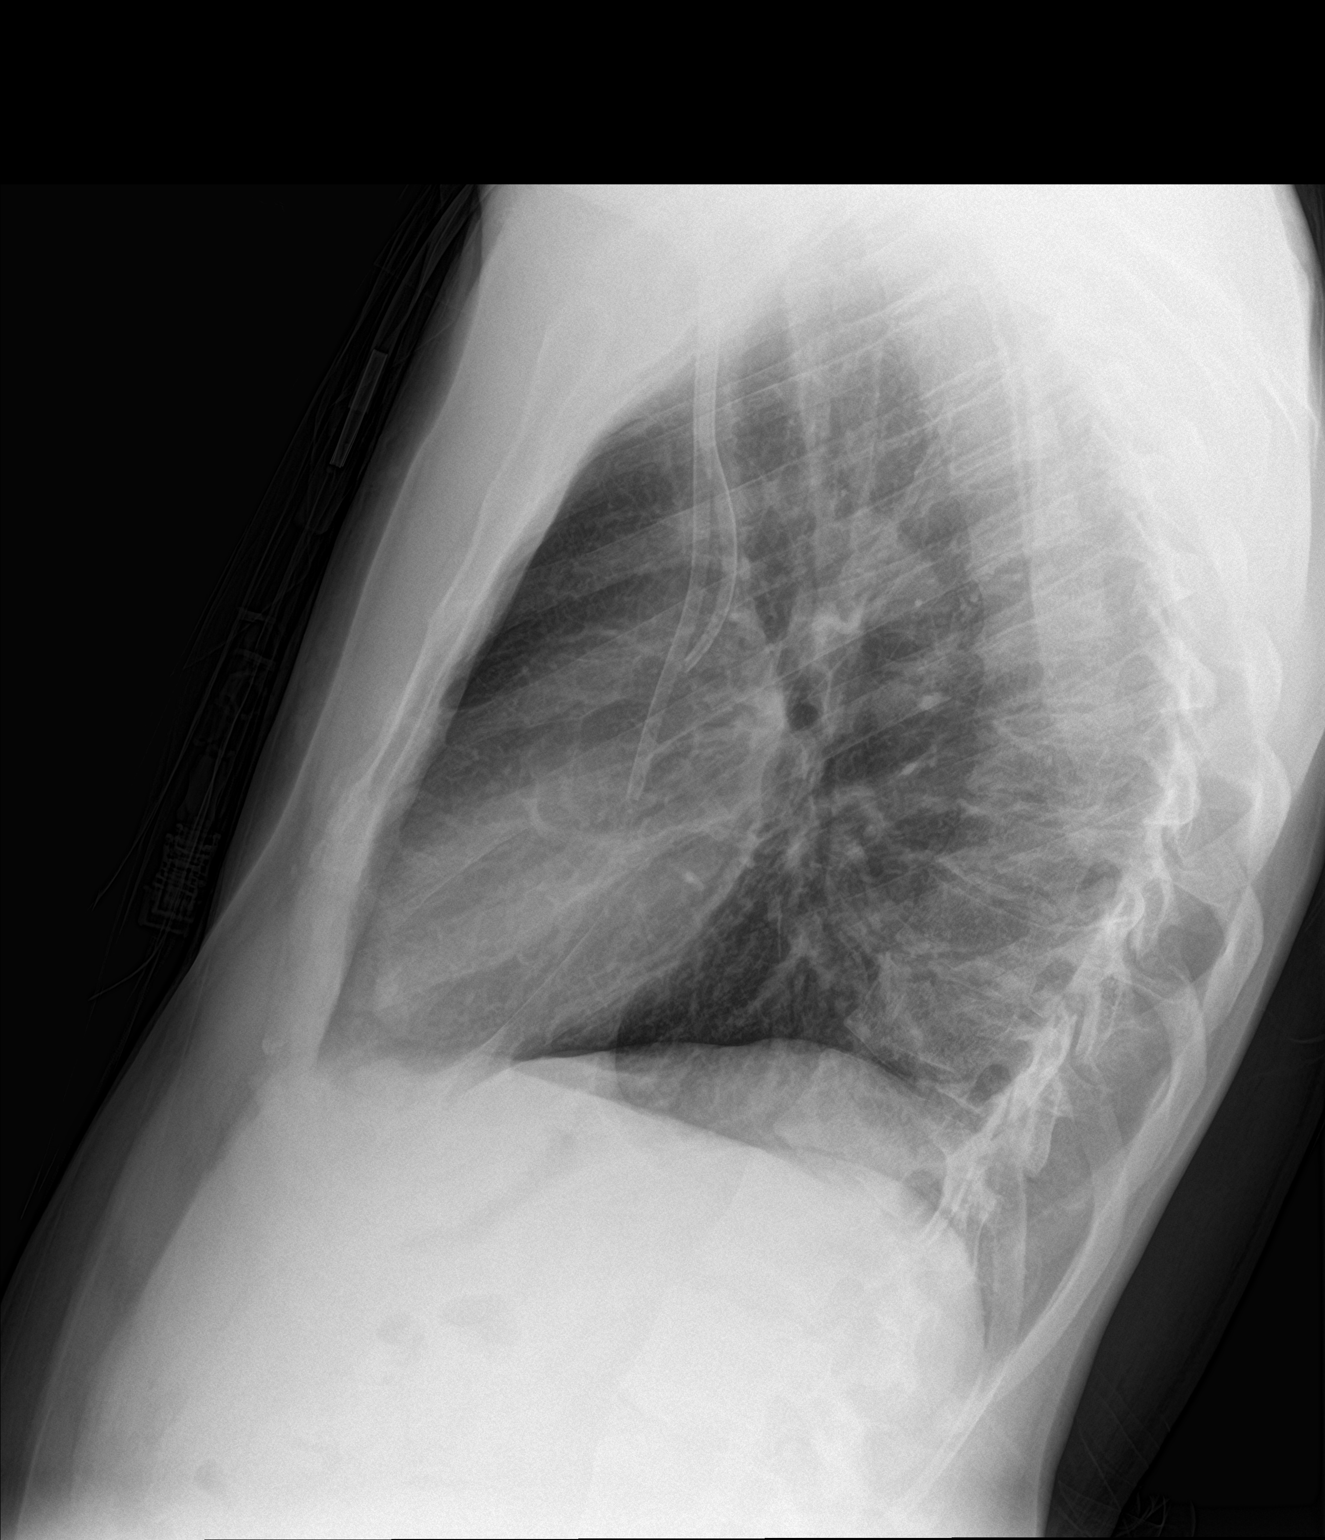

[2 of 2 positions shown; findings below may reference images not displayed]

FINDINGS: The lungs are well-aerated. Vascular congestion is noted. A
right-sided dual-lumen catheter is noted ending about the distal
SVC. There is no evidence of focal opacification, pleural effusion
or pneumothorax.

The heart is normal in size; the mediastinal contour is within
normal limits. No acute osseous abnormalities are seen.
IMPRESSION: Vascular congestion noted. Lungs remain grossly clear. Right-sided
dual-lumen catheter noted ending about the distal SVC.
